# Patient Record
Sex: Female | Born: 1995 | Race: Black or African American | Hispanic: No | Marital: Single | Smoking: Former smoker
Health system: Southern US, Community
[De-identification: ages and names within clinical notes are randomized; demographics above are authoritative.]

## PROBLEM LIST (undated history)

## (undated) ENCOUNTER — Inpatient Hospital Stay (HOSPITAL_COMMUNITY): Payer: Self-pay

## (undated) DIAGNOSIS — F909 Attention-deficit hyperactivity disorder, unspecified type: Secondary | ICD-10-CM

## (undated) DIAGNOSIS — F32A Depression, unspecified: Secondary | ICD-10-CM

## (undated) DIAGNOSIS — O24419 Gestational diabetes mellitus in pregnancy, unspecified control: Secondary | ICD-10-CM

## (undated) DIAGNOSIS — O165 Unspecified maternal hypertension, complicating the puerperium: Secondary | ICD-10-CM

## (undated) HISTORY — DX: Attention-deficit hyperactivity disorder, unspecified type: F90.9

## (undated) HISTORY — DX: Unspecified maternal hypertension, complicating the puerperium: O16.5

## (undated) HISTORY — DX: Depression, unspecified: F32.A

## (undated) HISTORY — PX: NO PAST SURGERIES: SHX2092

## (undated) HISTORY — PX: COSMETIC SURGERY: SHX468

---
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
## 2015-09-02 DIAGNOSIS — O09899 Supervision of other high risk pregnancies, unspecified trimester: Secondary | ICD-10-CM | POA: Insufficient documentation

## 2017-04-30 HISTORY — PX: CHOLECYSTECTOMY: SHX55

## 2017-05-02 ENCOUNTER — Inpatient Hospital Stay (HOSPITAL_COMMUNITY)
Admission: AD | Admit: 2017-05-02 | Discharge: 2017-05-02 | Disposition: A | Discharge status: Home / Self Care | Payer: Self-pay | Source: Ambulatory Visit | Attending: Obstetrics and Gynecology | Admitting: Obstetrics and Gynecology

## 2017-05-02 ENCOUNTER — Other Ambulatory Visit: Payer: Self-pay

## 2017-05-02 ENCOUNTER — Encounter (HOSPITAL_COMMUNITY): Payer: Self-pay

## 2017-05-02 DIAGNOSIS — K259 Gastric ulcer, unspecified as acute or chronic, without hemorrhage or perforation: Secondary | ICD-10-CM | POA: Insufficient documentation

## 2017-05-02 DIAGNOSIS — Z8632 Personal history of gestational diabetes: Secondary | ICD-10-CM | POA: Insufficient documentation

## 2017-05-02 DIAGNOSIS — R109 Unspecified abdominal pain: Secondary | ICD-10-CM

## 2017-05-02 DIAGNOSIS — Z87891 Personal history of nicotine dependence: Secondary | ICD-10-CM | POA: Insufficient documentation

## 2017-05-02 DIAGNOSIS — R1030 Lower abdominal pain, unspecified: Secondary | ICD-10-CM | POA: Insufficient documentation

## 2017-05-02 DIAGNOSIS — G8929 Other chronic pain: Secondary | ICD-10-CM

## 2017-05-02 DIAGNOSIS — R1013 Epigastric pain: Secondary | ICD-10-CM

## 2017-05-02 DIAGNOSIS — K279 Peptic ulcer, site unspecified, unspecified as acute or chronic, without hemorrhage or perforation: Secondary | ICD-10-CM

## 2017-05-02 HISTORY — DX: Gestational diabetes mellitus in pregnancy, unspecified control: O24.419

## 2017-05-02 LAB — URINALYSIS, ROUTINE W REFLEX MICROSCOPIC
Bacteria, UA: NONE SEEN
Bilirubin Urine: NEGATIVE
Glucose, UA: NEGATIVE mg/dL
Hgb urine dipstick: NEGATIVE
Ketones, ur: NEGATIVE mg/dL
Nitrite: NEGATIVE
Protein, ur: NEGATIVE mg/dL
Specific Gravity, Urine: 1.028 (ref 1.005–1.030)
pH: 5 (ref 5.0–8.0)

## 2017-05-02 LAB — POCT PREGNANCY, URINE: Preg Test, Ur: NEGATIVE

## 2017-05-02 MED ORDER — FAMOTIDINE 20 MG PO TABS: 20.0000 mg | ORAL_TABLET | Freq: Two times a day (BID) | ORAL | 0 refills | Status: DC

## 2017-05-02 MED ORDER — KETOROLAC TROMETHAMINE 60 MG/2ML IM SOLN
60.0000 mg | Freq: Once | INTRAMUSCULAR | Status: AC
  Administered 2017-05-02: 60 mg via INTRAMUSCULAR
  Filled 2017-05-02: qty 2

## 2017-05-02 MED ORDER — GI COCKTAIL ~~LOC~~
30.0000 mL | Freq: Once | ORAL | Status: AC
  Administered 2017-05-02: 30 mL via ORAL
  Filled 2017-05-02: qty 30

## 2017-05-02 NOTE — MAU Note (Signed)
[place]ce[place] in [place], Dr [facility] - pt moving to this area. Had a baby Oct 13th, Vaginal delivery.  Upper abd pain since after delivery.  Feels cramping, sharp, and bunched up - sometimes worse after eating, now the pain is every day.  Dr Elwyn ReachBooth wanted her to have an ultrasound, was scheduled for November but was unable to go due to insurance reason. No bleeding. No birth control.

## 2017-05-02 NOTE — MAU Provider Note (Signed)
Chief Complaint: Abdominal Pain   First Provider Initiated Contact with Patient 05/02/17 0226      SUBJECTIVE HPI: Hayley Lawson is a 22 y.o. G3P3003 not pregnant patient who presents to maternity admissions reporting stomach pain for the last two months since she delivered her baby at [facility]. She presents to MAU for the pain due to planning on moving here this weekend.  She denies vaginal bleeding, vaginal itching/burning, urinary symptoms, h/a, dizziness, n/v, or fever/chills. She reports her DR in [place] stated she needs to get an [place] which has been scheduled there.  Abdominal Pain  This is a chronic problem. The current episode started more than 1 month ago. The onset quality is gradual. The problem occurs 2 to 4 times per day. The problem has been unchanged. The pain is located in the epigastric region. The pain is at a severity of 3/10. The quality of the pain is sharp and aching. The abdominal pain does not radiate. Associated symptoms include belching. Pertinent negatives include no constipation, diarrhea, headaches, nausea or vomiting. The pain is aggravated by eating and certain positions. The pain is relieved by nothing. She has tried antacids for the symptoms. The treatment provided mild relief. There is no history of abdominal surgery, gallstones or GERD.    Past Medical History:  Diagnosis Date  . Gestational diabetes    History reviewed. No pertinent surgical history. Social History   Socioeconomic History  . Marital status: Single    Spouse name: Not on file  . Number of children: Not on file  . Years of education: Not on file  . Highest education level: Not on file  Social Needs  . Financial resource strain: Not on file  . Food insecurity - worry: Not on file  . Food insecurity - inability: Not on file  . Transportation needs - medical: Not on file  . Transportation needs - non-medical: Not on file  Occupational History  . Not on file  Tobacco Use  .  Smoking status: Former Games developer  . Smokeless tobacco: Never Used  Substance and Sexual Activity  . Alcohol use: No  . Drug use: No  . Sexual activity: Not on file  Other Topics Concern  . Not on file  Social History Narrative  . Not on file   No current facility-administered medications on file prior to encounter.    Current Outpatient Medications on File Prior to Encounter  Medication Sig Dispense Refill  . OVER THE COUNTER MEDICATION Place 3 drops into the right eye 3 (three) times daily. [facility] homeopathic iriated eye drop     No Known Allergies  ROS:  Review of Systems  Constitutional: Negative.   Respiratory: Negative.   Cardiovascular: Negative.   Gastrointestinal: Positive for abdominal pain. Negative for constipation, diarrhea, nausea and vomiting.  Genitourinary: Negative.   Musculoskeletal: Negative.   Neurological: Negative for headaches.  Psychiatric/Behavioral: Negative.    I have reviewed patient's Past Medical Hx, Surgical Hx, Family Hx, Social Hx, medications and allergies.   Physical Exam   Patient Vitals for the past 24 hrs:  BP Temp Temp src Pulse Resp SpO2 Height Weight  05/02/17 0310 134/66 97.7 F (36.5 C) Oral 71 17 100 % - -  05/02/17 0151 108/73 97.9 F (36.6 C) Oral 83 18 100 % 5\' 2"  (1.575 m) 181 lb (82.1 kg)   Constitutional: Well-developed, well-nourished female in no acute distress.  Cardiovascular: normal rate Respiratory: normal effort GI: Abd soft, non-tender. Pos BS x 4  MS: Extremities nontender, no edema, normal ROM Neurologic: Alert and oriented x 4.  GU: Neg CVAT.  LAB RESULTS Results for orders placed or performed during the hospital encounter of 05/02/17 (from the past 24 hour(s))  Urinalysis, Routine w reflex microscopic     Status: Abnormal   Collection Time: 05/02/17  1:50 AM  Result Value Ref Range   Color, Urine YELLOW YELLOW   APPearance HAZY (A) CLEAR   Specific Gravity, Urine 1.028 1.005 - 1.030   pH 5.0 5.0 - 8.0    Glucose, UA NEGATIVE NEGATIVE mg/dL   Hgb urine dipstick NEGATIVE NEGATIVE   Bilirubin Urine NEGATIVE NEGATIVE   Ketones, ur NEGATIVE NEGATIVE mg/dL   Protein, ur NEGATIVE NEGATIVE mg/dL   Nitrite NEGATIVE NEGATIVE   Leukocytes, UA TRACE (A) NEGATIVE   RBC / HPF 0-5 0 - 5 RBC/hpf   WBC, UA 6-30 0 - 5 WBC/hpf   Bacteria, UA NONE SEEN NONE SEEN   Squamous Epithelial / LPF 6-30 (A) NONE SEEN   Mucus PRESENT   Pregnancy, urine POC     Status: None   Collection Time: 05/02/17  2:01 AM  Result Value Ref Range   Preg Test, Ur NEGATIVE NEGATIVE     MAU Management/MDM: Orders Placed This Encounter  Procedures  . Urinalysis, Routine w reflex microscopic  . Pregnancy, urine POC    Meds ordered this encounter  Medications  . gi cocktail (Maalox,Lidocaine,Donnatal)  . ketorolac (TORADOL) injection 60 mg    Treatments in MAU included GI cocktail- mild relief with medication, Toradol 60mg  IM- relief from pain, rates pain 1/10. Pain most likely related to Peptic ulcer. Pt discharged with instructions to follow up as scheduled with dr in [place] or to start primary care here. Educated on reasons to return to MAU and reasons to go to Urgent care/EDs   ASSESSMENT 1. Abdominal pain in female patient   2. Chronic epigastric pain   3. Peptic ulcer     PLAN Discharge home Educated on Peptic ulcer with information given on treatments plus food that can aggravate ulcer [facility] or call office in the morning to get into primary care  Rx for Pepcid   Allergies as of 05/02/2017   No Known Allergies     Medication List    STOP taking these medications   OVER THE COUNTER MEDICATION     TAKE these medications   famotidine 20 MG tablet Commonly known as:  PEPCID Take 1 tablet (20 mg total) by mouth 2 (two) times daily.       Steward DroneVeronica Braylin Xu  Certified Nurse-Midwife 05/02/2017  2:37 AM

## 2017-05-02 NOTE — Discharge Instructions (Signed)
In late 2019, [facility] will be [facility] to [facility]. At that time, the MAU (Maternity Admissions Unit), where you are being seen today, will no longer take care of non-pregnant patients. We strongly encourage you to find a doctor's office before that time, so that you can be seen with any GYN concerns, like vaginal discharge, urinary tract infection, etc.. in a timely manner.  In order to make an office visit more convenient, the Center [facility] will be offering evening hours with same-day appointments, walk-in appointments and scheduled appointments available during this time.  Center for [facility] @ [facility] Hours: Monday - 8am - 7:30 pm with walk-in between 4pm- 7:30 pm Tuesday - 8 am - 5 pm (starting 07/30/17 we will be open late and accepting walk-ins from 4pm - 7:30pm) Wednesday - 8 am - 5 pm (starting 10/30/17 we will be open late and accepting walk-ins from 4pm - 7:30pm) Thursday 8 am - 5 pm (starting 01/30/18 we will be open late and accepting walk-ins from 4pm - 7:30pm) Friday 8 am - 5 pm  For an appointment please call the Center for [facility] @ [facility] at [phone]  For urgent needs, [facility] is also available for management of urgent GYN complaints such as vaginal discharge or urinary tract infections.  Abdominal Pain, Adult Abdominal pain can be caused by many things. Often, abdominal pain is not serious and it gets better with no treatment or by being treated at home. However, sometimes abdominal pain is serious. Your health care provider will do a medical history and a physical exam to try to determine the cause of your abdominal pain. Follow these instructions at home:  Take over-the-counter and prescription medicines only as told by your health care provider. Do not take a laxative unless told by your health care provider.  Drink enough fluid to keep your urine clear or pale  yellow.  Watch your condition for any changes.  Keep all follow-up visits as told by your health care provider. This is important. Contact a health care provider if:  Your abdominal pain changes or gets worse.  You are not hungry or you lose weight without trying.  You are constipated or have diarrhea for more than 2-3 days.  You have pain when you urinate or have a bowel movement.  Your abdominal pain wakes you up at night.  Your pain gets worse with meals, after eating, or with certain foods.  You are throwing up and cannot keep anything down.  You have a fever. Get help right away if:  Your pain does not go away as soon as your health care provider told you to expect.  You cannot stop throwing up.  Your pain is only in areas of the abdomen, such as the right side or the left lower portion of the abdomen.  You have bloody or black stools, or stools that look like tar.  You have severe pain, cramping, or bloating in your abdomen.  You have signs of dehydration, such as: ? Dark urine, very little urine, or no urine. ? Cracked lips. ? Dry mouth. ? Sunken eyes. ? Sleepiness. ? Weakness. This information is not intended to replace advice given to you by your health care provider. Make sure you discuss any questions you have with your health care provider. Document Released: 01/24/2005 Document Revised: 11/04/2015 Document Reviewed: 09/28/2015 Elsevier Interactive Patient Education  [employer]2018 Elsevier Inc.

## 2017-06-28 ENCOUNTER — Other Ambulatory Visit: Payer: Self-pay | Admitting: Certified Nurse Midwife

## 2017-08-03 ENCOUNTER — Encounter (HOSPITAL_BASED_OUTPATIENT_CLINIC_OR_DEPARTMENT_OTHER): Payer: Self-pay | Admitting: *Deleted

## 2017-08-03 ENCOUNTER — Other Ambulatory Visit: Payer: Self-pay

## 2017-08-03 ENCOUNTER — Emergency Department (HOSPITAL_BASED_OUTPATIENT_CLINIC_OR_DEPARTMENT_OTHER)
Admission: EM | Admit: 2017-08-03 | Discharge: 2017-08-03 | Disposition: A | Discharge status: Other Acute Inpatient Hospital | Payer: Commercial Managed Care - PPO | Attending: Emergency Medicine | Admitting: Emergency Medicine

## 2017-08-03 ENCOUNTER — Inpatient Hospital Stay (HOSPITAL_COMMUNITY): Payer: Commercial Managed Care - PPO

## 2017-08-03 ENCOUNTER — Inpatient Hospital Stay (EMERGENCY_DEPARTMENT_HOSPITAL)
Admission: AD | Admit: 2017-08-03 | Discharge: 2017-08-03 | Disposition: A | Discharge status: Home / Self Care | Payer: Commercial Managed Care - PPO | Source: Ambulatory Visit | Attending: Obstetrics & Gynecology | Admitting: Obstetrics & Gynecology

## 2017-08-03 ENCOUNTER — Encounter (HOSPITAL_COMMUNITY): Payer: Self-pay | Admitting: *Deleted

## 2017-08-03 DIAGNOSIS — N3000 Acute cystitis without hematuria: Secondary | ICD-10-CM

## 2017-08-03 DIAGNOSIS — Z3490 Encounter for supervision of normal pregnancy, unspecified, unspecified trimester: Secondary | ICD-10-CM

## 2017-08-03 DIAGNOSIS — O2310 Infections of bladder in pregnancy, unspecified trimester: Secondary | ICD-10-CM | POA: Diagnosis not present

## 2017-08-03 DIAGNOSIS — O2341 Unspecified infection of urinary tract in pregnancy, first trimester: Secondary | ICD-10-CM | POA: Insufficient documentation

## 2017-08-03 DIAGNOSIS — O26891 Other specified pregnancy related conditions, first trimester: Secondary | ICD-10-CM

## 2017-08-03 DIAGNOSIS — Z882 Allergy status to sulfonamides status: Secondary | ICD-10-CM

## 2017-08-03 DIAGNOSIS — Z87891 Personal history of nicotine dependence: Secondary | ICD-10-CM | POA: Insufficient documentation

## 2017-08-03 DIAGNOSIS — Z79899 Other long term (current) drug therapy: Secondary | ICD-10-CM | POA: Insufficient documentation

## 2017-08-03 DIAGNOSIS — Z3A01 Less than 8 weeks gestation of pregnancy: Secondary | ICD-10-CM

## 2017-08-03 DIAGNOSIS — R109 Unspecified abdominal pain: Secondary | ICD-10-CM

## 2017-08-03 DIAGNOSIS — Z3A00 Weeks of gestation of pregnancy not specified: Secondary | ICD-10-CM | POA: Insufficient documentation

## 2017-08-03 DIAGNOSIS — Z3491 Encounter for supervision of normal pregnancy, unspecified, first trimester: Secondary | ICD-10-CM

## 2017-08-03 DIAGNOSIS — O9989 Other specified diseases and conditions complicating pregnancy, childbirth and the puerperium: Secondary | ICD-10-CM | POA: Diagnosis present

## 2017-08-03 LAB — URINALYSIS, ROUTINE W REFLEX MICROSCOPIC
BILIRUBIN URINE: NEGATIVE
GLUCOSE, UA: NEGATIVE mg/dL
Hgb urine dipstick: NEGATIVE
KETONES UR: NEGATIVE mg/dL
Nitrite: NEGATIVE
PROTEIN: NEGATIVE mg/dL
Specific Gravity, Urine: 1.025 (ref 1.005–1.030)
pH: 6 (ref 5.0–8.0)

## 2017-08-03 LAB — URINALYSIS, MICROSCOPIC (REFLEX)

## 2017-08-03 LAB — COMPREHENSIVE METABOLIC PANEL
ALT: 57 U/L — AB (ref 14–54)
AST: 23 U/L (ref 15–41)
Albumin: 4 g/dL (ref 3.5–5.0)
Alkaline Phosphatase: 53 U/L (ref 38–126)
Anion gap: 7 (ref 5–15)
BUN: 10 mg/dL (ref 6–20)
CHLORIDE: 106 mmol/L (ref 101–111)
CO2: 23 mmol/L (ref 22–32)
CREATININE: 0.54 mg/dL (ref 0.44–1.00)
Calcium: 9.1 mg/dL (ref 8.9–10.3)
GFR calc Af Amer: >60 mL/min (ref >60)
GLUCOSE: 100 mg/dL — AB (ref 65–99)
Potassium: 3.9 mmol/L (ref 3.5–5.1)
Sodium: 136 mmol/L (ref 135–145)
Total Bilirubin: 0.4 mg/dL (ref 0.3–1.2)
Total Protein: 7.4 g/dL (ref 6.5–8.1)

## 2017-08-03 LAB — CBC
HCT: 38.6 % (ref 36.0–46.0)
Hemoglobin: 13.4 g/dL (ref 12.0–15.0)
MCH: 30.9 pg (ref 26.0–34.0)
MCHC: 34.7 g/dL (ref 30.0–36.0)
MCV: 88.9 fL (ref 78.0–100.0)
PLATELETS: 396 10*3/uL (ref 150–400)
RBC: 4.34 MIL/uL (ref 3.87–5.11)
RDW: 11.7 % (ref 11.5–15.5)
WBC: 11.9 10*3/uL — AB (ref 4.0–10.5)

## 2017-08-03 LAB — WET PREP, GENITAL
CLUE CELLS WET PREP: NONE SEEN
Sperm: NONE SEEN
Trich, Wet Prep: NONE SEEN
Yeast Wet Prep HPF POC: NONE SEEN

## 2017-08-03 LAB — ABO/RH: ABO/RH(D): O POS

## 2017-08-03 LAB — HCG, QUANTITATIVE, PREGNANCY: hCG, Beta Chain, Quant, S: 18183 m[IU]/mL — ABNORMAL HIGH (ref <5)

## 2017-08-03 MED ORDER — NITROFURANTOIN MONOHYD MACRO 100 MG PO CAPS
100.0000 mg | ORAL_CAPSULE | Freq: Once | ORAL | Status: AC
  Administered 2017-08-03: 100 mg via ORAL
  Filled 2017-08-03: qty 1

## 2017-08-03 MED ORDER — ALUM & MAG HYDROXIDE-SIMETH 200-200-20 MG/5ML PO SUSP
30.0000 mL | Freq: Once | ORAL | Status: AC
  Administered 2017-08-03: 30 mL via ORAL
  Filled 2017-08-03: qty 30

## 2017-08-03 MED ORDER — NITROFURANTOIN MONOHYD MACRO 100 MG PO CAPS: 100.0000 mg | ORAL_CAPSULE | Freq: Two times a day (BID) | ORAL | 0 refills | Status: DC

## 2017-08-03 MED ORDER — CEPHALEXIN 500 MG PO CAPS: 500.0000 mg | ORAL_CAPSULE | Freq: Four times a day (QID) | ORAL | 2 refills | Status: DC

## 2017-08-03 NOTE — ED Provider Notes (Signed)
[facility] EMERGENCY DEPARTMENT Provider Note   CSN: [id number] Arrival date & time: 08/03/17  0119     History   Chief Complaint Chief Complaint  Patient presents with  . Abdominal Pain    HPI Hayley Lawson is a 22 y.o. female.  The history is provided by the patient.  Abdominal Pain   This is a recurrent problem. The current episode started more than 1 week ago. The problem occurs constantly. The problem has not changed since onset.The pain is associated with an unknown factor. The pain is located in the epigastric region and LLQ. The quality of the pain is aching. The pain is moderate. Associated symptoms include nausea. Pertinent negatives include fever, melena and dysuria. Nothing aggravates the symptoms. Nothing relieves the symptoms. Past workup does not include CT scan. Her past medical history does not include PUD.  Has had 5 months of epigastric pain since delivering 10/13.  Has had irregular periods since that time and was told last week her pregnancy test was positive.  Has had LLQ pain since.    Past Medical History:  Diagnosis Date  . Gestational diabetes     There are no active problems to display for this patient.   History reviewed. No pertinent surgical history.   OB History    Gravida  4   Para  3   Term  3   Preterm      AB      Living  3     SAB      TAB      Ectopic      Multiple      Live Births  3            Home Medications    Prior to Admission medications   Medication Sig Start Date End Date Taking? Authorizing Provider  famotidine (PEPCID) 20 MG tablet Take 1 tablet (20 mg total) by mouth 2 (two) times daily. 05/02/17   Sharyon Cable, CNM  nitrofurantoin, macrocrystal-monohydrate, (MACROBID) 100 MG capsule Take 1 capsule (100 mg total) by mouth 2 (two) times daily. X 7 days 08/03/17   Nicanor Alcon, Christinna Sprung, MD    Family History No family history on file.  Social History Social History   Tobacco Use  .  Smoking status: Former Games developer  . Smokeless tobacco: Never Used  Substance Use Topics  . Alcohol use: No  . Drug use: No     Allergies   Bactrim [sulfamethoxazole-trimethoprim]   Review of Systems Review of Systems  Constitutional: Negative for fever.  Gastrointestinal: Positive for abdominal pain and nausea. Negative for anal bleeding, blood in stool and melena.  Genitourinary: Negative for dysuria, vaginal bleeding and vaginal discharge.  All other systems reviewed and are negative.    Physical Exam Updated Vital Signs BP 111/71   Pulse 74   Temp 98.3 F (36.8 C) (Oral)   Resp 16   Ht 5\' 2"  (1.575 m)   Wt 83 kg (183 lb)   LMP 06/16/2017   SpO2 100%   BMI 33.47 kg/m   Physical Exam  Constitutional: She is oriented to person, place, and time. She appears well-developed and well-nourished. No distress.  HENT:  Head: Normocephalic and atraumatic.  Nose: Nose normal.  Mouth/Throat: No oropharyngeal exudate.  Eyes: Pupils are equal, round, and reactive to light. Conjunctivae are normal.  Neck: Normal range of motion. Neck supple.  Cardiovascular: Normal rate, regular rhythm, normal heart sounds and intact distal pulses.  Pulmonary/Chest:  Effort normal and breath sounds normal. No stridor. She has no wheezes. She has no rales.  Abdominal: Soft. Bowel sounds are normal. She exhibits no distension, no fluid wave and no mass. There is no tenderness. There is no rebound, no guarding, no tenderness at McBurney's point and negative Murphy's sign.  Musculoskeletal: Normal range of motion.  Neurological: She is alert and oriented to person, place, and time.  Skin: Skin is warm and dry. Capillary refill takes less than 2 seconds.  Psychiatric: She has a normal mood and affect.     ED Treatments / Results  Labs (all labs ordered are listed, but only abnormal results are displayed) Results for orders placed or performed during the hospital encounter of 08/03/17  Comprehensive  metabolic panel  Result Value Ref Range   Sodium 136 135 - 145 mmol/L   Potassium 3.9 3.5 - 5.1 mmol/L   Chloride 106 101 - 111 mmol/L   CO2 23 22 - 32 mmol/L   Glucose, Bld 100 (H) 65 - 99 mg/dL   BUN 10 6 - 20 mg/dL   Creatinine, Ser 1.61 0.44 - 1.00 mg/dL   Calcium 9.1 8.9 - 09.6 mg/dL   Total Protein 7.4 6.5 - 8.1 g/dL   Albumin 4.0 3.5 - 5.0 g/dL   AST 23 15 - 41 U/L   ALT 57 (H) 14 - 54 U/L   Alkaline Phosphatase 53 38 - 126 U/L   Total Bilirubin 0.4 0.3 - 1.2 mg/dL   GFR calc non Af Amer >60 >60 mL/min   GFR calc Af Amer >60 >60 mL/min   Anion gap 7 5 - 15  CBC  Result Value Ref Range   WBC 11.9 (H) 4.0 - 10.5 K/uL   RBC 4.34 3.87 - 5.11 MIL/uL   Hemoglobin 13.4 12.0 - 15.0 g/dL   HCT 04.5 40.9 - 81.1 %   MCV 88.9 78.0 - 100.0 fL   MCH 30.9 26.0 - 34.0 pg   MCHC 34.7 30.0 - 36.0 g/dL   RDW 91.4 78.2 - 95.6 %   Platelets 396 150 - 400 K/uL  Urinalysis, Routine w reflex microscopic  Result Value Ref Range   Color, Urine YELLOW YELLOW   APPearance CLEAR CLEAR   Specific Gravity, Urine 1.025 1.005 - 1.030   pH 6.0 5.0 - 8.0   Glucose, UA NEGATIVE NEGATIVE mg/dL   Hgb urine dipstick NEGATIVE NEGATIVE   Bilirubin Urine NEGATIVE NEGATIVE   Ketones, ur NEGATIVE NEGATIVE mg/dL   Protein, ur NEGATIVE NEGATIVE mg/dL   Nitrite NEGATIVE NEGATIVE   Leukocytes, UA SMALL (A) NEGATIVE  hCG, quantitative, pregnancy  Result Value Ref Range   hCG, Beta Chain, Quant, S 18,183 (H) <5 mIU/mL  Urinalysis, Microscopic (reflex)  Result Value Ref Range   RBC / HPF 0-5 0 - 5 RBC/hpf   WBC, UA 6-30 0 - 5 WBC/hpf   Bacteria, UA MANY (A) NONE SEEN   Squamous Epithelial / LPF 6-30 (A) NONE SEEN   Mucus PRESENT    No results found.  EKG None  Radiology No results found.  Procedures Procedures (including critical care time)  Medications Ordered in ED Medications  nitrofurantoin (macrocrystal-monohydrate) (MACROBID) capsule 100 mg (100 mg Oral Given 08/03/17 0248)  alum & mag  hydroxide-simeth (MAALOX/MYLANTA) 200-200-20 MG/5ML suspension 30 mL (30 mLs Oral Given 08/03/17 0328)     Case d/w Dr. Despina Hidden will transport POV to MAU for [place] to determine location of pregancy   Exam is benign and  reassuring, I see no indication for CT imaging at this time.  No signs of acute cholecystitis.  She is stable for follow up with her PMD as an outpatient for this issue.    Final Clinical Impressions(s) / ED Diagnoses   Final diagnoses:  Pregnancy, unspecified gestational age  Acute cystitis without hematuria   Transfer to MAU to determine location of pregnancy.   ED Discharge Orders        Ordered    nitrofurantoin, macrocrystal-monohydrate, (MACROBID) 100 MG capsule  2 times daily     08/03/17 0412       Jini Horiuchi, MD 08/03/17 0440

## 2017-08-03 NOTE — Discharge Instructions (Signed)
[facility] Providers  ° ° °[facility]       Phone: [phone] ° °[facility] Phone: [phone] ° °[facility] [place]  Phone: [phone] ° °[facility] [place]  Phone: [phone] ° °[facility] [place]  Phone: [phone] ° °[facility]       Phone: [phone] ° °[facility]    Phone: [phone]  ° °[facility]    Phone: [phone] ° °[facility]    Phone: [phone] ° °[facility]    Phone: [phone] ° °[facility]    Phone: [phone] ° °[facility]       Phone: [phone]  ° °[facility]  Phone: [phone] ° °[facility]    Phone: [phone] ° °[facility]   Phone: [phone] ° °[facility]      Phone: [phone] ° °[facility]    Phone: [phone] ° °

## 2017-08-03 NOTE — ED Triage Notes (Signed)
Pt reports for 5 months (since the delivery of her last baby) she has had pain in the upper abdomen that comes and goes, describes as sharp. Also c/o left lower abdominal pain that has been constant. LBM just PTA, no fever,  no vaginal discharge/bleeding. Pain with urination. Vomiting today, no diarrhea. Pt reports she is pregnant (LMP 06/16/17).

## 2017-08-03 NOTE — MAU Provider Note (Signed)
Ectopic eval from [facility]  IUP UTI, changed macrobid to keflex to save cost  Chief Complaint: Abdominal Pain   None     SUBJECTIVE HPI: Hayley Lawson is a 22 y.o. G4P3003 at [redacted]w[redacted]d by LMP who presents to maternity admissions sent from [facility] for abdominal pain in early pregnancy. Her pain is constant and in the left lower quadrant, starting 1 week ago.  She had positive HPT last week. She has not tried any treatments. She has mild nausea without vomiting. There are no associated symptoms.  She had hcg of 18,183 today at [facility] and was diagnosed with UTI with Rx for Macrobid. She denies vaginal bleeding, vaginal itching/burning, urinary symptoms, h/a, dizziness, or fever/chills.     HPI  Past Medical History:  Diagnosis Date  . Gestational diabetes    Past Surgical History:  Procedure Laterality Date  . NO PAST SURGERIES     Social History   Socioeconomic History  . Marital status: Single    Spouse name: Not on file  . Number of children: Not on file  . Years of education: Not on file  . Highest education level: Not on file  Occupational History  . Not on file  Social Needs  . Financial resource strain: Not on file  . Food insecurity:    Worry: Not on file    Inability: Not on file  . Transportation needs:    Medical: Not on file    Non-medical: Not on file  Tobacco Use  . Smoking status: Former Games developer  . Smokeless tobacco: Never Used  Substance and Sexual Activity  . Alcohol use: No  . Drug use: No  . Sexual activity: Yes    Birth control/protection: None  Lifestyle  . Physical activity:    Days per week: Not on file    Minutes per session: Not on file  . Stress: Not on file  Relationships  . Social connections:    Talks on phone: Not on file    Gets together: Not on file    Attends religious service: Not on file    Active member of club or organization: Not on file    Attends meetings of clubs or organizations: Not on file    Relationship status: Not on  file  . Intimate partner violence:    Fear of current or ex partner: Not on file    Emotionally abused: Not on file    Physically abused: Not on file    Forced sexual activity: Not on file  Other Topics Concern  . Not on file  Social History Narrative  . Not on file   No current facility-administered medications on file prior to encounter.    Current Outpatient Medications on File Prior to Encounter  Medication Sig Dispense Refill  . famotidine (PEPCID) 20 MG tablet Take 1 tablet (20 mg total) by mouth 2 (two) times daily. 30 tablet 0   Allergies  Allergen Reactions  . Bactrim [Sulfamethoxazole-Trimethoprim] Rash    ROS:  Review of Systems  Constitutional: Negative for chills, fatigue and fever.  Respiratory: Negative for shortness of breath.   Cardiovascular: Negative for chest pain.  Gastrointestinal: Positive for abdominal pain. Negative for nausea and vomiting.  Genitourinary: Positive for pelvic pain. Negative for difficulty urinating, dysuria, flank pain, vaginal bleeding, vaginal discharge and vaginal pain.  Musculoskeletal: Negative for back pain.  Neurological: Negative for dizziness and headaches.  Psychiatric/Behavioral: Negative.      I have reviewed patient's Past Medical Hx,  Surgical Hx, Family Hx, Social Hx, medications and allergies.   Physical Exam   Patient Vitals for the past 24 hrs:  BP Temp Temp src Pulse Resp Weight  08/03/17 0636 123/75 98.1 F (36.7 C) Oral 85 17 -  08/03/17 0524 117/61 98 F (36.7 C) Oral 88 17 -  08/03/17 0515 - - - - - 83.9 kg (185 lb 0.6 oz)   Constitutional: Well-developed, well-nourished female in no acute distress.  Cardiovascular: normal rate Respiratory: normal effort GI: Abd soft, non-tender. Pos BS x 4 MS: Extremities nontender, no edema, normal ROM Neurologic: Alert and oriented x 4.  GU: Neg CVAT.  PELVIC EXAM: wet prep/GCC collected by blind swab   LAB RESULTS Results for orders placed or performed  during the hospital encounter of 08/03/17 (from the past 24 hour(s))  ABO/Rh     Status: None   Collection Time: 08/03/17  5:24 AM  Result Value Ref Range   ABO/RH(D)      O POS Performed at [facility], [address] [place], [place] [address]   Wet prep, genital     Status: Abnormal   Collection Time: 08/03/17  6:14 AM  Result Value Ref Range   Yeast Wet Prep HPF POC NONE SEEN NONE SEEN   Trich, Wet Prep NONE SEEN NONE SEEN   Clue Cells Wet Prep HPF POC NONE SEEN NONE SEEN   WBC, Wet Prep HPF POC FEW (A) NONE SEEN   Sperm NONE SEEN     --/--/O POS Performed at [facility], [address] [place], [place] [address]  [phone](04/06 0524)  IMAGING [place] Ob Less Than 14 Weeks With Ob Transvaginal  Result Date: 08/03/2017 CLINICAL DATA:  22 y/o  F; pain. EXAM: OBSTETRIC <14 WK US AND TRANSVAGINAL OB US TECHNIQUE: Both transabdominal and transvaginal ultrasound examinations were performed for complete evaluation of the gestation as well as the maternal uterus, adnexal regions, and pelvic cul-de-sac. Transvaginal technique was performed to assess early pregnancy. COMPARISON:  None. FINDINGS: Intrauterine gestational sac: Single Yolk sac:  Visualized. Embryo:  Visualized. Cardiac Activity: Visualized. Heart Rate: 110 bpm CRL:  4.6 mm mm   6 w   1 d                  US EDC: 03/28/2018 Subchorionic hemorrhage: Small subchorionic hemorrhage measuring 2.6 x 0.8 cm. Maternal uterus/adnexae: Normal. IMPRESSION: 1. Single live intrauterine pregnancy with estimated gestational age of [redacted] weeks and 1 day. 2. Small subchorionic hemorrhage. Electronically Signed   By: Mitzi HansenLance  Furusawa-Stratton M.D.   On: 08/03/2017 06:20    MAU Management/MDM: Ectopic eval started at [facility] with labwork and exam and [facility].  Rx for Macrobid for possible UTI sent prior to her arrival in MAU.  On my exam, pt without evidence of acute or surgical abdomen.  OB US ordered and vaginal cultures obtained.   Wet prep wnl.  US shows live IUP c/w LMP dating.  Pt to start prenatal care as desired, list of providers given. Discussed small subchorionic hemorrhage found incidentally on US with pt as likely low risk.  I changed Rx from Macrobid to Keflex QID x 7 days for safety in first trimester and cost as pt is unsure of her prescription coverage.  Questions answered.  Return to MAU for emergencies. Pt discharged with strict abdominal pain in pregnancy precautions.  ASSESSMENT 1. Normal IUP (intrauterine pregnancy) on prenatal ultrasound, first trimester   2. Abdominal pain during  pregnancy in first trimester   3. UTI (urinary tract infection) during pregnancy, first trimester     PLAN Discharge home Allergies as of 08/03/2017      Reactions   Bactrim [sulfamethoxazole-trimethoprim] Rash      Medication List    STOP taking these medications   nitrofurantoin (macrocrystal-monohydrate) 100 MG capsule Commonly known as:  MACROBID     TAKE these medications   cephALEXin 500 MG capsule Commonly known as:  KEFLEX Take 1 capsule (500 mg total) by mouth 4 (four) times daily.   famotidine 20 MG tablet Commonly known as:  PEPCID Take 1 tablet (20 mg total) by mouth 2 (two) times daily.      Follow-up Information    Prenatal provider of your choice Follow up.           Sharen Counter Certified Nurse-Midwife 08/03/2017  1:59 PM

## 2017-08-05 LAB — GC/CHLAMYDIA PROBE AMP (~~LOC~~) NOT AT ARMC
Chlamydia: NEGATIVE
Neisseria Gonorrhea: NEGATIVE

## 2017-08-20 ENCOUNTER — Other Ambulatory Visit: Payer: Self-pay | Admitting: Family Medicine

## 2017-08-20 DIAGNOSIS — R1011 Right upper quadrant pain: Secondary | ICD-10-CM

## 2017-08-21 ENCOUNTER — Ambulatory Visit
Admission: RE | Admit: 2017-08-21 | Discharge: 2017-08-21 | Disposition: A | Discharge status: Home / Self Care | Payer: Commercial Managed Care - PPO | Source: Ambulatory Visit | Attending: Family Medicine | Admitting: Family Medicine

## 2017-08-21 DIAGNOSIS — R1011 Right upper quadrant pain: Secondary | ICD-10-CM

## 2018-01-31 ENCOUNTER — Other Ambulatory Visit: Payer: Self-pay | Admitting: General Surgery

## 2018-06-15 ENCOUNTER — Encounter (HOSPITAL_COMMUNITY): Payer: Self-pay

## 2019-04-19 ENCOUNTER — Other Ambulatory Visit: Payer: Self-pay

## 2019-04-19 ENCOUNTER — Encounter (HOSPITAL_COMMUNITY): Payer: Self-pay | Admitting: Emergency Medicine

## 2019-04-19 ENCOUNTER — Emergency Department (HOSPITAL_COMMUNITY)
Admission: EM | Admit: 2019-04-19 | Discharge: 2019-04-20 | Disposition: A | Discharge status: Home / Self Care | Payer: Commercial Managed Care - PPO | Attending: Emergency Medicine | Admitting: Emergency Medicine

## 2019-04-19 ENCOUNTER — Emergency Department (HOSPITAL_COMMUNITY): Payer: Commercial Managed Care - PPO

## 2019-04-19 DIAGNOSIS — R519 Headache, unspecified: Secondary | ICD-10-CM | POA: Insufficient documentation

## 2019-04-19 DIAGNOSIS — R0602 Shortness of breath: Secondary | ICD-10-CM | POA: Insufficient documentation

## 2019-04-19 DIAGNOSIS — Z87891 Personal history of nicotine dependence: Secondary | ICD-10-CM | POA: Insufficient documentation

## 2019-04-19 DIAGNOSIS — R404 Transient alteration of awareness: Secondary | ICD-10-CM | POA: Diagnosis not present

## 2019-04-19 DIAGNOSIS — R4701 Aphasia: Secondary | ICD-10-CM | POA: Diagnosis not present

## 2019-04-19 DIAGNOSIS — R0789 Other chest pain: Secondary | ICD-10-CM | POA: Insufficient documentation

## 2019-04-19 LAB — COMPREHENSIVE METABOLIC PANEL
ALT: 26 U/L (ref 0–44)
AST: 22 U/L (ref 15–41)
Albumin: 3.8 g/dL (ref 3.5–5.0)
Alkaline Phosphatase: 56 U/L (ref 38–126)
Anion gap: 11 (ref 5–15)
BUN: 11 mg/dL (ref 6–20)
CO2: 22 mmol/L (ref 22–32)
Calcium: 9.3 mg/dL (ref 8.9–10.3)
Chloride: 103 mmol/L (ref 98–111)
Creatinine, Ser: 0.67 mg/dL (ref 0.44–1.00)
GFR calc Af Amer: >60 mL/min (ref >60)
GFR calc non Af Amer: >60 mL/min (ref >60)
Glucose, Bld: 112 mg/dL — ABNORMAL HIGH (ref 70–99)
Potassium: 3.7 mmol/L (ref 3.5–5.1)
Sodium: 136 mmol/L (ref 135–145)
Total Bilirubin: 0.6 mg/dL (ref 0.3–1.2)
Total Protein: 7.3 g/dL (ref 6.5–8.1)

## 2019-04-19 LAB — CBC
HCT: 38.5 % (ref 36.0–46.0)
Hemoglobin: 12.7 g/dL (ref 12.0–15.0)
MCH: 29.5 pg (ref 26.0–34.0)
MCHC: 33 g/dL (ref 30.0–36.0)
MCV: 89.3 fL (ref 80.0–100.0)
Platelets: 473 10*3/uL — ABNORMAL HIGH (ref 150–400)
RBC: 4.31 MIL/uL (ref 3.87–5.11)
RDW: 12.1 % (ref 11.5–15.5)
WBC: 10.4 10*3/uL (ref 4.0–10.5)
nRBC: 0 % (ref 0.0–0.2)

## 2019-04-19 LAB — I-STAT BETA HCG BLOOD, ED (MC, WL, AP ONLY): I-stat hCG, quantitative: <5 m[IU]/mL (ref <5)

## 2019-04-19 LAB — I-STAT CHEM 8, ED
BUN: 12 mg/dL (ref 6–20)
Calcium, Ion: 1.16 mmol/L (ref 1.15–1.40)
Chloride: 104 mmol/L (ref 98–111)
Creatinine, Ser: 0.6 mg/dL (ref 0.44–1.00)
Glucose, Bld: 109 mg/dL — ABNORMAL HIGH (ref 70–99)
HCT: 38 % (ref 36.0–46.0)
Hemoglobin: 12.9 g/dL (ref 12.0–15.0)
Potassium: 3.7 mmol/L (ref 3.5–5.1)
Sodium: 140 mmol/L (ref 135–145)
TCO2: 27 mmol/L (ref 22–32)

## 2019-04-19 LAB — DIFFERENTIAL
Abs Immature Granulocytes: 0.03 10*3/uL (ref 0.00–0.07)
Basophils Absolute: 0.1 10*3/uL (ref 0.0–0.1)
Basophils Relative: 1 %
Eosinophils Absolute: 0.1 10*3/uL (ref 0.0–0.5)
Eosinophils Relative: 1 %
Immature Granulocytes: 0 %
Lymphocytes Relative: 31 %
Lymphs Abs: 3.2 10*3/uL (ref 0.7–4.0)
Monocytes Absolute: 0.9 10*3/uL (ref 0.1–1.0)
Monocytes Relative: 8 %
Neutro Abs: 6.1 10*3/uL (ref 1.7–7.7)
Neutrophils Relative %: 59 %

## 2019-04-19 MED ORDER — SODIUM CHLORIDE 0.9% FLUSH: 3.0000 mL | Freq: Once | INTRAVENOUS | Status: DC

## 2019-04-19 NOTE — ED Triage Notes (Addendum)
Pt arrives via [facility] from home, ems reports pt was wrapping presents when she wrote a note to her husband stating "I feel like my brain wont let me talk." ems reports pt will answer yes or no questions by shaking her head, follows all commands, no neuro deficits present. Pt was able to answer her daughters age with clear speech but would not answer any other questions. Pt endorses increased emotional stress recently when asked. EMS VS: Hr96, O299, 138/100, cbg 107. Pt did endorse recent covid exposure but unable to say when. Face symmetrical, grip strength equal, moves all limbs equally and appears to be oriented based on nodding yes or no to questions appropriately.

## 2019-04-19 NOTE — ED Notes (Signed)
This RN consulted with Quincy Carnes, PA regarding orders for patient. PA advised to place stroke orders but not to call code stroke at this time.

## 2019-04-20 DIAGNOSIS — R404 Transient alteration of awareness: Secondary | ICD-10-CM | POA: Diagnosis not present

## 2019-04-20 LAB — URINALYSIS, ROUTINE W REFLEX MICROSCOPIC
Bilirubin Urine: NEGATIVE
Glucose, UA: NEGATIVE mg/dL
Ketones, ur: NEGATIVE mg/dL
Leukocytes,Ua: NEGATIVE
Nitrite: NEGATIVE
Protein, ur: NEGATIVE mg/dL
Specific Gravity, Urine: 1.026 (ref 1.005–1.030)
pH: 5 (ref 5.0–8.0)

## 2019-04-20 LAB — POC URINE PREG, ED: Preg Test, Ur: NEGATIVE

## 2019-04-20 NOTE — ED Notes (Signed)
Pt reporting onset of a pain in her chest and head tonight, stating that she felt like she could not talk. Says she feels better now, has a pain on the left side of her head. Stating her children tested positive on Thursday for covid, her test was negative.

## 2019-04-20 NOTE — ED Provider Notes (Signed)
Emergency Department Provider Note   I have reviewed the triage vital signs and the nursing notes.   HISTORY  Chief Complaint Aphasia   HPI Hayley Lawson is a 23 y.o. female without medical problems who presents the emerge department today for transient difficulty speaking.  Patient states that she was in her normal state of health when she started having some left-sided cheek pain started radiating into her head and progressively worsened.  She had a little bit episode of shortness of breath and then felt like she could not speak.  She states that she was forming words in her head but could not figure out how to talk.  During this episode last approximately 3 hours patient was able to speak to EMS but then could not speak again.  She states at some point prior to seeing me, in the waiting room, she regained her ability to speak.  She has no other neurologic changes.  She has 2 children who have coronavirus she was diagnosed negative but does not feel that this is stress throughout the much.  No recent illnesses.   No other associated or modifying symptoms.    Past Medical History:  Diagnosis Date  . Gestational diabetes     There are no problems to display for this patient.   Past Surgical History:  Procedure Laterality Date  . NO PAST SURGERIES      Current Outpatient Rx  . Order #: [id number] Class: Normal  . Order #: [id number] Class: Normal    Allergies Bactrim [sulfamethoxazole-trimethoprim]  Family History  Problem Relation Age of Onset  . Alcohol abuse Neg Hx   . Arthritis Neg Hx   . Asthma Neg Hx   . Birth defects Neg Hx   . COPD Neg Hx   . Cancer Neg Hx   . Depression Neg Hx   . Diabetes Neg Hx   . Drug abuse Neg Hx   . Early death Neg Hx   . Hearing loss Neg Hx   . Heart disease Neg Hx   . Hyperlipidemia Neg Hx   . Hypertension Neg Hx   . Kidney disease Neg Hx   . Learning disabilities Neg Hx   . Mental illness Neg Hx   . Mental retardation Neg Hx     . Miscarriages / Stillbirths Neg Hx   . Stroke Neg Hx   . Vision loss Neg Hx   . Varicose Veins Neg Hx     Social History Social History   Tobacco Use  . Smoking status: Former Games developermoker  . Smokeless tobacco: Never Used  Substance Use Topics  . Alcohol use: No  . Drug use: No    Review of Systems  All other systems negative except as documented in the HPI. All pertinent positives and negatives as reviewed in the HPI. ____________________________________________   PHYSICAL EXAM:  VITAL SIGNS: ED Triage Vitals  Enc Vitals Group     BP 04/19/19 2236 134/84     Pulse Rate 04/19/19 2236 100     Resp 04/19/19 2237 14     Temp 04/19/19 2236 98.7 F (37.1 C)     Temp Source 04/19/19 2236 Oral     SpO2 04/19/19 2236 100 %    Constitutional: Alert and oriented. Well appearing and in no acute distress. Eyes: Conjunctivae are normal. PERRL. EOMI. Head: Atraumatic. Nose: No congestion/rhinnorhea. Mouth/Throat: Mucous membranes are moist.  Oropharynx non-erythematous. Neck: No stridor.  No meningeal signs.   Cardiovascular: Normal rate, regular rhythm.  Good peripheral circulation. Grossly normal heart sounds.   Respiratory: Normal respiratory effort.  No retractions. Lungs CTAB. Gastrointestinal: Soft and nontender. No distention.  Musculoskeletal: No lower extremity tenderness nor edema. No gross deformities of extremities. Neurologic:  No altered mental status, able to give full seemingly accurate history.  Face is symmetric, EOM's intact, pupils equal and reactive, vision intact, tongue and uvula midline without deviation. Upper and Lower extremity motor 5/5, intact pain perception in distal extremities, 2+ reflexes in biceps, patella and achilles tendons. Able to perform finger to nose normal with both hands. Walks without assistance or evident ataxia.  Skin:  Skin is warm, dry and intact. No rash noted.   ____________________________________________   LABS (all labs  ordered are listed, but only abnormal results are displayed)  Labs Reviewed  CBC - Abnormal; Notable for the following components:      Result Value   Platelets 473 (*)    All other components within normal limits  COMPREHENSIVE METABOLIC PANEL - Abnormal; Notable for the following components:   Glucose, Bld 112 (*)    All other components within normal limits  URINALYSIS, ROUTINE W REFLEX MICROSCOPIC - Abnormal; Notable for the following components:   APPearance CLOUDY (*)    Hgb urine dipstick MODERATE (*)    Bacteria, UA RARE (*)    All other components within normal limits  I-STAT CHEM 8, ED - Abnormal; Notable for the following components:   Glucose, Bld 109 (*)    All other components within normal limits  URINE CULTURE  DIFFERENTIAL  PROTIME-INR  APTT  I-STAT BETA HCG BLOOD, ED ([facility], [facility], [facility] ONLY)  POC URINE PREG, ED   ____________________________________________  EKG   EKG Interpretation  Date/Time:  Sunday April 19 2019 22:42:51 EST Ventricular Rate:  104 PR Interval:  146 QRS Duration: 78 QT Interval:  322 QTC Calculation: 423 R Axis:   81 Text Interpretation: Sinus tachycardia Otherwise normal ECG No old tracing to compare Confirmed by Dione Booze ([id number]) on 04/19/2019 11:37:23 PM       ____________________________________________  RADIOLOGY  CT HEAD WO CONTRAST  Result Date: 04/19/2019 CLINICAL DATA:  Speech difficulty EXAM: CT HEAD WITHOUT CONTRAST TECHNIQUE: Contiguous axial images were obtained from the base of the skull through the vertex without intravenous contrast. COMPARISON:  None. FINDINGS: Brain: No evidence of acute territorial infarction, hemorrhage, hydrocephalus,extra-axial collection or mass lesion/mass effect. Normal gray-white differentiation. Ventricles are normal in size and contour. Vascular: No hyperdense vessel or unexpected calcification. Skull: The skull is intact. No fracture or focal lesion identified. Sinuses/Orbits: There is  complete opacification of the right maxillary sinus. The orbits and globes intact. Other: None IMPRESSION: No acute intracranial abnormality. Right maxillary sinusitis. Electronically Signed   By: Jonna Clark M.D.   On: 04/19/2019 23:43    ____________________________________________   PROCEDURES  Procedure(s) performed:   Procedures   ____________________________________________   INITIAL IMPRESSION / ASSESSMENT AND PLAN / ED COURSE  Conversion disorder versus atypical panic attack as possible cause.  I doubt she has had a transient ischemic attack or CVA.   Here she is neuro intact without abnormalities on workup. No indication for further imaging.  Consider possible carotid or vertebral dissection as she did have some headache prior to the pain however expect the neuro deficit to be ongoing and also transient and intermittent in nature.  However follow-up with neurology for further management.     Pertinent labs & imaging results that were available during my care  of the patient were reviewed by me and considered in my medical decision making (see chart for details).   A medical screening exam was performed and I feel the patient has had an appropriate workup for their chief complaint at this time and likelihood of emergent condition existing is low. They have been counseled on decision, discharge, follow up and which symptoms necessitate immediate return to the emergency department. They or their family verbally stated understanding and agreement with plan and discharged in stable condition.   ____________________________________________  FINAL CLINICAL IMPRESSION(S) / ED DIAGNOSES  Final diagnoses:  Transient alteration of awareness     MEDICATIONS GIVEN DURING THIS VISIT:  Medications  sodium chloride flush (NS) 0.9 % injection 3 mL (has no administration in time range)     NEW OUTPATIENT MEDICATIONS STARTED DURING THIS VISIT:  Discharge Medication List as of  04/20/2019  4:16 AM      Note:  This note was prepared with assistance of Dragon voice recognition software. Occasional wrong-word or sound-a-like substitutions may have occurred due to the inherent limitations of voice recognition software.   Cutler Sunday, Corene Cornea, MD 04/20/19 [phone]

## 2019-04-21 LAB — URINE CULTURE

## 2019-05-25 ENCOUNTER — Ambulatory Visit (INDEPENDENT_AMBULATORY_CARE_PROVIDER_SITE_OTHER): Payer: Commercial Managed Care - PPO | Admitting: Neurology

## 2019-05-25 ENCOUNTER — Encounter: Payer: Self-pay | Admitting: Neurology

## 2019-05-25 ENCOUNTER — Other Ambulatory Visit: Payer: Self-pay

## 2019-05-25 VITALS — BP 134/81 | HR 84 | Temp 95.7°F | Ht 62.0 in | Wt 175.3 lb

## 2019-05-25 DIAGNOSIS — R2990 Unspecified symptoms and signs involving the nervous system: Secondary | ICD-10-CM | POA: Diagnosis not present

## 2019-05-25 DIAGNOSIS — H538 Other visual disturbances: Secondary | ICD-10-CM

## 2019-05-25 DIAGNOSIS — G43019 Migraine without aura, intractable, without status migrainosus: Secondary | ICD-10-CM | POA: Diagnosis not present

## 2019-05-25 DIAGNOSIS — G43109 Migraine with aura, not intractable, without status migrainosus: Secondary | ICD-10-CM

## 2019-05-25 MED ORDER — AMITRIPTYLINE HCL 25 MG PO TABS: ORAL_TABLET | ORAL | 3 refills | Status: DC

## 2019-05-25 NOTE — Patient Instructions (Addendum)
I believe you have recurrent migraines, you may have had what we call a complicated migraine when you had difficulty talking.  Your head CT was without any obvious abnormality and your neurological exam is normal which is reassuring.  I would recommend proceeding with an ultrasound of your neck arteries to make sure there is no significant impairment of your blood flow.   I would also recommend that you get a formal eye examination done through an optometrist or ophthalmologist of your choice.  For migraine prevention, I recommend Elavil (generic name: amitriptyline) 25 mg: Take half a pill daily at bedtime for one week, then one pill daily at bedtime for one week, then one and a half pills daily at bedtime for one week, then 2 pills daily at bedtime thereafter. Common side effects reported are: mouth dryness, drowsiness, confusion, dizziness.  We may consider a sleep study down the road, as untreated obstructive sleep apnea may cause recurrent headaches.

## 2019-05-25 NOTE — Progress Notes (Signed)
Subjective:    Patient ID: Hayley Lawson is a 24 y.o. female.  HPI     Huston Foley, MD, PhD [facility] [address] [address] [place], [place] [address]  I saw patient, Hayley Lawson, as a referral from the emergency room for aphasia.  The patient is accompanied by her fianc today.  She is a 24 year old right-handed woman with a underlying history of obesity, who presented to the emergency room on 04/19/2019 with approximately 3-hour episode of intermittent difficulty speaking.  She had inability to speak off and on which lasted altogether about 3 hours.  In the emergency room, she had a nonfocal examination.  She had reported some shortness of breath and left-sided cheek pain and also some headache at the time.  I reviewed the emergency room records.  She had a head CT without contrast on 04/19/2019 and I reviewed the results: IMPRESSION: No acute intracranial abnormality.   Right maxillary sinusitis.   She reports intermittent one-sided headaches, they are often associated with blurry vision and nausea, typically no vomiting.  She has more headaches around her menstrual period.  She has noticed an increase in her headache frequency to up to 2/week, sometimes more than that and has taken as needed BC powder but tries to avoid taking it daily because of the caffeine content.  She does report drinking caffeine in the form of coffee, 2 to 3 cups/day on average, she tries to hydrate well with water, about 3 bottles a day on average.  She lives with her fianc and her 3 children, ages 73, 86 and 2.  She does not always sleep very well.  She is a light sleeper and attributes this to having small children.  Sometimes the 44-year-old does not sleep through the night.  She snores but does not have any pauses in her breathing, she has woken up with the occasional headache, has nocturia about once per average night.  She has no witnessed apneas per fianc's report.  She has  a family history of migraines.  She has not been on any migraine preventative medication.  She has not had any similar episode with speech difficulty.  She has not had any one-sided weakness or numbness.  She still has recurrent headaches though.  She has not had a formal eye examination in years.  Her Past Medical History Is Significant For: Past Medical History:  Diagnosis Date  . Gestational diabetes     Her Past Surgical History Is Significant For: Past Surgical History:  Procedure Laterality Date  . NO PAST SURGERIES      Her Family History Is Significant For: Family History  Problem Relation Age of Onset  . Stroke Father   . Depression Father   . Anxiety disorder Father   . Diabetes Father   . Alcohol abuse Neg Hx   . Arthritis Neg Hx   . Asthma Neg Hx   . Birth defects Neg Hx   . COPD Neg Hx   . Cancer Neg Hx   . Drug abuse Neg Hx   . Early death Neg Hx   . Hearing loss Neg Hx   . Heart disease Neg Hx   . Hyperlipidemia Neg Hx   . Hypertension Neg Hx   . Kidney disease Neg Hx   . Learning disabilities Neg Hx   . Mental illness Neg Hx   . Mental retardation Neg Hx   . Miscarriages / Stillbirths Neg Hx   . Vision  loss Neg Hx   . Varicose Veins Neg Hx     Her Social History Is Significant For: Social History   Socioeconomic History  . Marital status: Single    Spouse name: Not on file  . Number of children: Not on file  . Years of education: Not on file  . Highest education level: Not on file  Occupational History  . Not on file  Tobacco Use  . Smoking status: Former Research scientist (life sciences)  . Smokeless tobacco: Never Used  Substance and Sexual Activity  . Alcohol use: No  . Drug use: No  . Sexual activity: Yes    Birth control/protection: None  Other Topics Concern  . Not on file  Social History Narrative  . Not on file   Social Determinants of Health   Financial Resource Strain:   . Difficulty of Paying Living Expenses: Not on file  Food Insecurity:   .  Worried About Charity fundraiser in the Last Year: Not on file  . Ran Out of Food in the Last Year: Not on file  Transportation Needs:   . Lack of Transportation (Medical): Not on file  . Lack of Transportation (Non-Medical): Not on file  Physical Activity:   . Days of Exercise per Week: Not on file  . Minutes of Exercise per Session: Not on file  Stress:   . Feeling of Stress : Not on file  Social Connections:   . Frequency of Communication with Friends and Family: Not on file  . Frequency of Social Gatherings with Friends and Family: Not on file  . Attends Religious Services: Not on file  . Active Member of Clubs or Organizations: Not on file  . Attends Archivist Meetings: Not on file  . Marital Status: Not on file    Her Allergies Are:  Allergies  Allergen Reactions  . Bactrim [Sulfamethoxazole-Trimethoprim] Rash  :   Her Current Medications Are:  Outpatient Encounter Medications as of 05/25/2019  Medication Sig  . amitriptyline (ELAVIL) 25 MG tablet 1/2 pill each bedtime x 1 week, then 1 pill nightly x 1 week, then 1 1/2 pills nightly x 1 week, then 2 pills nightly thereafter.  . [DISCONTINUED] cephALEXin (KEFLEX) 500 MG capsule Take 1 capsule (500 mg total) by mouth 4 (four) times daily.  . [DISCONTINUED] famotidine (PEPCID) 20 MG tablet Take 1 tablet (20 mg total) by mouth 2 (two) times daily.   No facility-administered encounter medications on file as of 05/25/2019.  :   Review of Systems:  Out of a complete 14 point review of systems, all are reviewed and negative with the exception of these symptoms as listed below:  Review of Systems  Neurological:       Possible TIA, here fore neurology work up.     Objective:  Neurological Exam  Physical Exam Physical Examination:   Vitals:   05/25/19 1432  BP: 134/81  Pulse: 84  Temp: (!) 95.7 F (35.4 C)    General Examination: The patient is a very pleasant 24 y.o. female in no acute distress. She  appears well-developed and well-nourished and well groomed.   HEENT: Normocephalic, atraumatic, pupils are equal, round and reactive to light and accommodation. Funduscopic exam is normal with sharp disc margins noted. Extraocular tracking is good without limitation to gaze excursion or nystagmus noted. Normal smooth pursuit is noted. Hearing is grossly intact.ar bilaterally. Face is symmetric with normal facial animation and normal facial sensation. Speech is clear with no dysarthria  noted. There is no hypophonia. There is no lip, neck/head, jaw or voice tremor. Neck is supple with full range of passive and active motion. There are no carotid bruits on auscultation. Oropharynx exam reveals: mild mouth dryness, good dental hygiene and mild airway crowding, due to Smaller airway entry and tonsillar size of 1-2+.  Mallampati is class I.  Tongue protrudes centrally and palate elevates symmetrically.   Chest: Clear to auscultation without wheezing, rhonchi or crackles noted.  Heart: S1+S2+0, regular and normal without murmurs, rubs or gallops noted.   Abdomen: Soft, non-tender and non-distended with normal bowel sounds appreciated on auscultation.  Extremities: There is no pitting edema in the distal lower extremities bilaterally. Pedal pulses are intact.  Skin: Warm and dry without trophic changes noted.  Musculoskeletal: exam reveals no obvious joint deformities, tenderness or joint swelling or erythema.   Neurologically:  Mental status: The patient is awake, alert and oriented in all 4 spheres. Her immediate and remote memory, attention, language skills and fund of knowledge are appropriate. There is no evidence of aphasia, agnosia, apraxia or anomia. Speech is clear with normal prosody and enunciation. Thought process is linear. Mood is normal and affect is normal.  Cranial nerves II - XII are as described above under HEENT exam. In addition: shoulder shrug is normal with equal shoulder height  noted. Motor exam: Normal bulk, strength and tone is noted. There is no drift, tremor or rebound. Romberg is negative. Reflexes are 2+ throughout. Babinski: Toes are flexor bilaterally. Fine motor skills and coordination: intact with normal finger taps, normal hand movements, normal rapid alternating patting, normal foot taps and normal foot agility.  Cerebellar testing: No dysmetria or intention tremor on finger to nose testing. Heel to shin is unremarkable bilaterally. There is no truncal or gait ataxia.  Sensory exam: intact to light touch, vibration, temperature sense in the upper and lower extremities.  Gait, station and balance: She stands easily. No veering to one side is noted. No leaning to one side is noted. Posture is age-appropriate and stance is narrow based. Toes tend to point outwards bilaterally.  Gait shows normal stride length and normal pace. No problems turning are noted. tandem walk is unremarkable.   Assessment and Plan:  In summary, Hayley Lawson is a very pleasant 24 y.o.-year old female With an underlying medical history of recurrent headaches and obesity, who presents as a referral from the ER for evaluation of her recent episode of aphasia, associated with headache, she may have had a complex migraine.  She does report intermittent migraine headaches and feels that these have become worse over time.  She is cautioned regarding the regular use of BC powder because of side effects and perpetuating headaches as well as the caffeine content.  She tries to be mindful of this.  She is advised to proceed with further testing in the form of a carotid Doppler ultrasound to make sure there is no significant blood flow problem.  Her CT head in the emergency room was benign, neurological exam is nonfocal and she is largely reassured.  She has had fairly frequent migrainous headaches. She will likely benefit from migraine prevention.  We talked about abortive and preventative treatment.  She  is advised to start with amitriptyline 25 mg strength half a pill at night with gradual titration.  I talked to her about expectations, potential side effects and limitations of the medication.  She was given written instructions and a new prescription as well. We may consider  a sleep study down the road as she does snore and has a mildly crowded airway, also has mild obesity which may be a risk factor for underlying sleep disordered breathing.  She is advised to follow-up routinely with a nurse practitioner in 3 months, sooner if needed, we will call her in the interim with her carotid Doppler test results.  She is encouraged to seek a formal eye examination as she has not had an eye exam in several years and Reports intermittent blurry vision, likely in the context of a migraine.  She declines a prescription for as needed nausea medication such as Zofran at this time. I answered all their questions today and the patient and her fianc were in agreement with the plan. Huston Foley, MD, PhD

## 2019-08-24 ENCOUNTER — Ambulatory Visit: Payer: Commercial Managed Care - PPO | Admitting: Family Medicine

## 2019-08-24 ENCOUNTER — Encounter: Payer: Self-pay | Admitting: Family Medicine

## 2020-09-22 ENCOUNTER — Other Ambulatory Visit: Payer: Self-pay | Admitting: Internal Medicine

## 2020-09-23 LAB — URINE CULTURE
MICRO NUMBER:: 11938778
SPECIMEN QUALITY:: ADEQUATE

## 2020-11-01 ENCOUNTER — Other Ambulatory Visit: Payer: Self-pay

## 2020-11-01 ENCOUNTER — Encounter: Payer: Medicaid Other | Admitting: Obstetrics and Gynecology

## 2021-08-13 ENCOUNTER — Emergency Department (HOSPITAL_BASED_OUTPATIENT_CLINIC_OR_DEPARTMENT_OTHER)
Admission: EM | Admit: 2021-08-13 | Discharge: 2021-08-13 | Disposition: A | Discharge status: Home / Self Care | Payer: Medicaid Other | Attending: Emergency Medicine | Admitting: Emergency Medicine

## 2021-08-13 ENCOUNTER — Other Ambulatory Visit: Payer: Self-pay

## 2021-08-13 ENCOUNTER — Emergency Department (HOSPITAL_BASED_OUTPATIENT_CLINIC_OR_DEPARTMENT_OTHER): Payer: Medicaid Other

## 2021-08-13 ENCOUNTER — Encounter (HOSPITAL_BASED_OUTPATIENT_CLINIC_OR_DEPARTMENT_OTHER): Payer: Self-pay | Admitting: Emergency Medicine

## 2021-08-13 DIAGNOSIS — R519 Headache, unspecified: Secondary | ICD-10-CM

## 2021-08-13 LAB — PREGNANCY, URINE: Preg Test, Ur: NEGATIVE

## 2021-08-13 MED ORDER — METOCLOPRAMIDE HCL 5 MG/ML IJ SOLN
10.0000 mg | Freq: Once | INTRAMUSCULAR | Status: AC
  Administered 2021-08-13: 10 mg via INTRAVENOUS
  Filled 2021-08-13: qty 2

## 2021-08-13 MED ORDER — DIPHENHYDRAMINE HCL 50 MG/ML IJ SOLN
50.0000 mg | Freq: Once | INTRAMUSCULAR | Status: AC
  Administered 2021-08-13: 50 mg via INTRAVENOUS
  Filled 2021-08-13: qty 1

## 2021-08-13 NOTE — ED Provider Notes (Signed)
?[facility] EMERGENCY DEPT ?Provider Note ? ? ?CSN: [id number] ?Arrival date & time: 08/13/21  1446 ? ?  ? ?History ? ?Chief Complaint  ?Patient presents with  ? Migraine  ? ? ?Hayley Lawson is a 26 y.o. female with reported history of migraine headaches.  Presents emergency department for complaint of headache with visual disturbances.  Patient states that she has been dealing with migraines for approximately the last year.  Patient states that she did see a neurologist but cannot remember who she saw and states that they did not give her any prescriptions. ? ?Patient states that she has had a migraine for the last month.  Migraine has been constant over the last week.  Pain is located to her occipital region and frontal temporal aspect of her head.  She describes the pain as a pressure with intermittent thumping."  Patient rates her pain 4/10 the pain scale at present.  Patient states that she has been having nausea and vomiting associated with her migraine headache.  Additionally patient reports that she has been having episodes of vision loss and double vision.  Patient states that the symptoms will come on randomly usually when her headache is intensifying.  Patient states that the symptoms last for approximately 2 minutes or less and then spontaneously resolved.  Patient says that both eyes are affected. ? ?Patient denies any recent falls or traumatic injuries.  Denies any illicit drug use or alcohol use. ? ?Patient denies any facial asymmetry, slurred speech, numbness, weakness, neck pain, neck stiffness, fever, chills, chest pain, shortness of breath. ? ? ?Migraine ?Associated symptoms include headaches. Pertinent negatives include no chest pain, no abdominal pain and no shortness of breath.  ? ?  ? ?Home Medications ?Prior to Admission medications   ?Medication Sig Start Date End Date Taking? Authorizing Provider  ?amitriptyline (ELAVIL) 25 MG tablet 1/2 pill each bedtime x 1 week, then 1 pill  nightly x 1 week, then 1 1/2 pills nightly x 1 week, then 2 pills nightly thereafter. 05/25/19   Huston Foley, MD  ?   ? ?Allergies    ?Bactrim [sulfamethoxazole-trimethoprim]   ? ?Review of Systems   ?Review of Systems  ?Constitutional:  Negative for chills and fever.  ?HENT:  Negative for facial swelling.   ?Eyes:  Positive for photophobia and visual disturbance.  ?Respiratory:  Negative for shortness of breath.   ?Cardiovascular:  Negative for chest pain.  ?Gastrointestinal:  Positive for nausea and vomiting. Negative for abdominal pain and diarrhea.  ?Genitourinary:  Negative for difficulty urinating, dysuria, hematuria and urgency.  ?Musculoskeletal:  Negative for back pain, neck pain and neck stiffness.  ?Skin:  Negative for color change and rash.  ?Neurological:  Positive for headaches. Negative for dizziness, tremors, seizures, syncope, facial asymmetry, speech difficulty, weakness, light-headedness and numbness.  ?Psychiatric/Behavioral:  Negative for confusion.   ? ?Physical Exam ?Updated Vital Signs ?BP 124/81   Pulse 82   Temp 98 ?F (36.7 ?C)   Resp 16   Ht 5\' 2"  (1.575 m)   Wt 70.8 kg   LMP 07/27/2021   SpO2 100%   Breastfeeding No   BMI 28.53 kg/m?  ?Physical Exam ?Vitals and nursing note reviewed.  ?Constitutional:   ?   General: She is not in acute distress. ?   Appearance: She is not ill-appearing, toxic-appearing or diaphoretic.  ?HENT:  ?   Head: Normocephalic and atraumatic.  ?Eyes:  ?   General: No scleral icterus.    ?  Right eye: No discharge.     ?   Left eye: No discharge.  ?   Extraocular Movements: Extraocular movements intact.  ?   Conjunctiva/sclera: Conjunctivae normal.  ?   Pupils: Pupils are equal, round, and reactive to light.  ?Cardiovascular:  ?   Rate and Rhythm: Normal rate.  ?Pulmonary:  ?   Effort: Pulmonary effort is normal.  ?Musculoskeletal:  ?   Cervical back: Normal range of motion and neck supple. No rigidity.  ?Skin: ?   General: Skin is warm and dry.   ?Neurological:  ?   General: No focal deficit present.  ?   Mental Status: She is alert and oriented to person, place, and time.  ?   GCS: GCS eye subscore is 4. GCS verbal subscore is 5. GCS motor subscore is 6.  ?   Cranial Nerves: No cranial nerve deficit or facial asymmetry.  ?   Sensory: Sensation is intact.  ?   Motor: No weakness, tremor, seizure activity or pronator drift.  ?   Coordination: Romberg sign negative. Finger-Nose-Finger Test normal.  ?   Gait: Gait is intact. Gait normal.  ?   Comments: CN II-XII intact, equal grip strength, +5 strength to bilateral upper and lower extremities, sensation to light touch grossly intact to bilateral upper and lower extremities.  ?Psychiatric:     ?   Behavior: Behavior is cooperative.  ? ? ?ED Results / Procedures / Treatments   ?Labs ?(all labs ordered are listed, but only abnormal results are displayed) ?Labs Reviewed - No data to display ? ?EKG ?None ? ?Radiology ?CT Head Wo Contrast ? ?Result Date: 08/13/2021 ?CLINICAL DATA:  Migraine headache for the past month, now with double vision, dizziness, and emesis. EXAM: CT HEAD WITHOUT CONTRAST TECHNIQUE: Contiguous axial images were obtained from the base of the skull through the vertex without intravenous contrast. RADIATION DOSE REDUCTION: This exam was performed according to the departmental dose-optimization program which includes automated exposure control, adjustment of the mA and/or kV according to patient size and/or use of iterative reconstruction technique. COMPARISON:  CT head dated April 19, 2019. FINDINGS: Brain: No evidence of acute infarction, hemorrhage, hydrocephalus, extra-axial collection or mass lesion/mass effect. Vascular: No hyperdense vessel or unexpected calcification. Skull: Normal. Negative for fracture or focal lesion. Sinuses/Orbits: No acute finding. 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 Other: None. IMPRESSION: 1. No acute intracranial abnormality.  Electronically Signed   By: Obie Dredge M.D.   On: 08/13/2021 20:14   ? ?Procedures ?Procedures  ? ? ?Medications Ordered in ED ?Medications  ?metoCLOPramide (REGLAN) injection 10 mg (10 mg Intravenous Given 08/13/21 1935)  ?diphenhydrAMINE (BENADRYL) injection 50 mg (50 mg Intravenous Given 08/13/21 1934)  ? ? ?ED Course/ Medical Decision Making/ A&P ?  ?                        ?Medical Decision Making ?Amount and/or Complexity of Data Reviewed ?Labs: ordered. ?Radiology: ordered. ? ?Risk ?Prescription drug management. ? ? ?Alert 26 year old female in no acute distress, nontoxic-appearing.  Presents the emergency department with a complaint of migraine headache. ? ?Information obtained from patient.  Past medical records were reviewed including previous provider notes, labs, and imaging.  Patient has medical history as outlined in HPI with complicates her care. ? ?Patient reports visual disturbances with intensifying migraine headache.  Suspect complex migraine.  Neuro exam is reassuring at this time.  Patient denies any visual disturbance  at this time.  Will obtain CT imaging of head to evaluate for intracranial mass causing patient's symptoms.  Patient given migraine cocktail. ? ?I personally viewed interpretations CT imaging.  Agree with radiology interpretation of no acute intracranial abnormality. ? ?Patient reports improvement in pain after receiving migraine cocktail.  Patient continues to have no focal neurological deficits or visual disturbance.  Will discharge patient at this time to follow-up with neurology in the outpatient setting for further management of her migraine headaches. ? ?Patient was discussed with and evaluated by Dr. Stevie Kernykstra. ? ?Based on patient's chief complaint, I considered admission might be necessary, however after reassuring ED workup feel patient is reasonable for discharge.  Discussed results, findings, treatment and follow up. Patient advised of return precautions. Patient  verbalized understanding and agreed with plan. ? ?Portions of this note were generated with Scientist, clinical (histocompatibility and immunogenetics)Dragon dictation software. Dictation errors may occur despite best attempts at proofreading. ? ? ? ? ? ? ? ? ? ?Final Clinical Impression(s) /

## 2021-08-13 NOTE — ED Triage Notes (Signed)
Pt c/o migraine headache for 1 month. Pt now having emesis, double vision and dizziness. Pt seen at urgent care for same on Friday.  ?

## 2021-08-13 NOTE — Discharge Instructions (Signed)
Drink plenty of fluids, take Tylenol or Motrin for pain control.  Follow-up with your primary doctor and neurology specialist regarding your headaches. ?

## 2021-08-13 NOTE — ED Notes (Signed)
Patient transported to CT 

## 2021-08-17 ENCOUNTER — Ambulatory Visit: Payer: Medicaid Other | Admitting: Neurology

## 2021-08-17 ENCOUNTER — Encounter: Payer: Self-pay | Admitting: Neurology

## 2021-08-17 VITALS — BP 114/74 | HR 87 | Ht 62.0 in | Wt 161.4 lb

## 2021-08-17 DIAGNOSIS — G4440 Drug-induced headache, not elsewhere classified, not intractable: Secondary | ICD-10-CM

## 2021-08-17 DIAGNOSIS — G43019 Migraine without aura, intractable, without status migrainosus: Secondary | ICD-10-CM | POA: Diagnosis not present

## 2021-08-17 MED ORDER — AMITRIPTYLINE HCL 25 MG PO TABS: ORAL_TABLET | ORAL | 3 refills | Status: DC

## 2021-08-17 MED ORDER — ONDANSETRON HCL 4 MG PO TABS: 4.0000 mg | ORAL_TABLET | Freq: Three times a day (TID) | ORAL | 0 refills | Status: DC | PRN

## 2021-08-17 MED ORDER — RIZATRIPTAN BENZOATE 5 MG PO TBDP: 5.0000 mg | ORAL_TABLET | ORAL | 3 refills | Status: DC | PRN

## 2021-08-17 NOTE — Progress Notes (Signed)
Subjective:  ?  ?Patient ID: Hayley Lawson is a 26 y.o. female. ? ?HPI ? ? ? ?Huston Foley, MD, PhD ?[facility] ?[address], [address] ?[phone] ?[place], [place] [address] ? ?I saw patient, Hayley Lawson, as a referral from the emergency room for concern for migraines.  The patient is unaccompanied today.  Hayley Lawson is a 26 year old right-handed woman with an underlying medical history of gestational diabetes, and overweight state, who reports increase in her migraines over the past month, she has nearly daily headaches associated with nausea, typically no vomiting, she has light sensitivity and blurry vision, sometimes loss of vision briefly.  She has not had a recent eye examination.  We had talked about evaluation with an eye exam a couple of years ago, she did not pursue it, she has not tried amitriptyline I prescribed a couple of years ago.  She is not sure if she even filled it.  She has a family history of migraines.  She drinks caffeine in the form of coffee, 2 cups/day, tries to hydrate well with water, estimates that she drinks about 2-3 bottles per day, 16.9 ounce size each.  She is a non-smoker, does not drink alcohol currently.  She has tried Zofran in the past for nausea.  She denies any sudden onset of one-sided weakness or numbness or tingling or droopy face or slurring of speech.  She presented to the emergency room on 08/13/2021 and I reviewed the MRI records.  He presented a history of recurrent headaches, she had visual disturbance.  She had a head CT without contrast on 08/13/2021 and I reviewed the results: IMPRESSION: ?1. No acute intracranial abnormality.  She was treated symptomatically with Reglan, and Benadryl. ? ?I had evaluated her in 2021 for an episode of aphasia and she also reported recurrent headaches.  She was advised to get a formal eye examination done.  I started her on amitriptyline for migraine prevention.  She was advised to stop using BC powder  regularly.  We talked about medication overuse headaches.  We talked about potentially proceeding with a sleep study.  She was advised to proceed with a carotid Doppler ultrasound.  She did not have it done.  She no showed for her follow-up appointment in April 2021. ? ? ?Previously:  ? ?05/25/19: 26 year old right-handed woman with a underlying history of obesity, who presented to the emergency room on 04/19/2019 with approximately 3-hour episode of intermittent difficulty speaking.  She had inability to speak off and on which lasted altogether about 3 hours.  In the emergency room, she had a nonfocal examination.  She had reported some shortness of breath and left-sided cheek pain and also some headache at the time.  I reviewed the emergency room records.  She had a head CT without contrast on 04/19/2019 and I reviewed the results: IMPRESSION: ?No acute intracranial abnormality. ?  ?Right maxillary sinusitis. ?  ?She reports intermittent one-sided headaches, they are often associated with blurry vision and nausea, typically no vomiting.  She has more headaches around her menstrual period.  She has noticed an increase in her headache frequency to up to 2/week, sometimes more than that and has taken as needed BC powder but tries to avoid taking it daily because of the caffeine content.  She does report drinking caffeine in the form of coffee, 2 to 3 cups/day on average, she tries to hydrate well with water, about 3 bottles a day on average.  She lives with her  fianc? and her 3 children, ages 88, 3 and 2.  She does not always sleep very well.  She is a light sleeper and attributes this to having small children.  Sometimes the 69-year-old does not sleep through the night.  She snores but does not have any pauses in her breathing, she has woken up with the occasional headache, has nocturia about once per average night.  She has no witnessed apneas per fianc?'s report.  She has a family history of migraines.  She has not  been on any migraine preventative medication.  She has not had any similar episode with speech difficulty.  She has not had any one-sided weakness or numbness.  She still has recurrent headaches though.  She has not had a formal eye examination in years. ? ?Her Past Medical History Is Significant For: ?Past Medical History:  ?Diagnosis Date  ? Gestational diabetes   ? ? ?Her Past Surgical History Is Significant For: ?Past Surgical History:  ?Procedure Laterality Date  ? NO PAST SURGERIES    ? ? ?Her Family History Is Significant For: ?Family History  ?Problem Relation Age of Onset  ? Migraines Mother   ? Stroke Father   ? Depression Father   ? Anxiety disorder Father   ? Diabetes Father   ? Alcohol abuse Neg Hx   ? Arthritis Neg Hx   ? Asthma Neg Hx   ? Birth defects Neg Hx   ? COPD Neg Hx   ? Cancer Neg Hx   ? Drug abuse Neg Hx   ? Early death Neg Hx   ? Hearing loss Neg Hx   ? Heart disease Neg Hx   ? Hyperlipidemia Neg Hx   ? Hypertension Neg Hx   ? Kidney disease Neg Hx   ? Learning disabilities Neg Hx   ? Mental illness Neg Hx   ? Mental retardation Neg Hx   ? Miscarriages / Stillbirths Neg Hx   ? Vision loss Neg Hx   ? Varicose Veins Neg Hx   ? ? ?Her Social History Is Significant For: ?Social History  ? ?Socioeconomic History  ? Marital status: Single  ?  Spouse name: Not on file  ? Number of children: Not on file  ? Years of education: Not on file  ? Highest education level: Not on file  ?Occupational History  ? Not on file  ?Tobacco Use  ? Smoking status: Former  ? Smokeless tobacco: Never  ?Vaping Use  ? Vaping Use: Never used  ?Substance and Sexual Activity  ? Alcohol use: No  ? Drug use: No  ? Sexual activity: Yes  ?  Birth control/protection: None  ?Other Topics Concern  ? Not on file  ?Social History Narrative  ? Not on file  ? ?Social Determinants of Health  ? ?Financial Resource Strain: Not on file  ?Food Insecurity: Not on file  ?Transportation Needs: Not on file  ?Physical Activity: Not on file   ?Stress: Not on file  ?Social Connections: Not on file  ? ? ?Her Allergies Are:  ?Allergies  ?Allergen Reactions  ? Bactrim [Sulfamethoxazole-Trimethoprim] Rash  ?:  ? ?Her Current Medications Are:  ?Outpatient Encounter Medications as of 08/17/2021  ?Medication Sig  ? levonorgestrel (MIRENA) 20 MCG/DAY IUD by Intrauterine route.  ? VYVANSE 20 MG capsule Take 20 mg by mouth every morning.  ? amitriptyline (ELAVIL) 25 MG tablet 1/2 pill each bedtime x 1 week, then 1 pill nightly x 1 week, then 1 1/2  pills nightly x 1 week, then 2 pills nightly thereafter.  ? amphetamine-dextroamphetamine (ADDERALL XR) 10 MG 24 hr capsule  (Patient not taking: Reported on 08/17/2021)  ? ?No facility-administered encounter medications on file as of 08/17/2021.  ?: ? ? ?Review of Systems:  ?Out of a complete 14 point review of systems, all are reviewed and negative with the exception of these symptoms as listed below:  ? ?Review of Systems  ?Neurological:   ?     Pt is here for increased migraines. Pt states in the last month migraine more frequently.Pt states the only thing she takes for migraines is BC powder.. Pt states she had  double vision,black outs ,aura and dizziness, and vomiting. Pt states she saw black for a split second  ?  ? ?Objective:  ?Neurological Exam ? ?Physical Exam ?Physical Examination:  ? ?Vitals:  ? 08/17/21 1428  ?BP: 114/74  ?Pulse: 87  ? ? ?General Examination: The patient is a very pleasant 26 y.o. female in no acute distress. She appears well-developed and well-nourished and well groomed.  ? ?HEENT: Normocephalic, atraumatic, pupils are equal, round and reactive to light, mildly sensitive to light but funduscopic exam doable and benign.  Extraocular tracking without limitation, hearing grossly intact, face is symmetric with normal facial animation, speech is clear without dysarthria, hypophonia or voice tremor.  No carotid bruits.  Airway examination reveals stable findings, tongue protrudes centrally and  palate elevates symmetrically.   ?  ?Chest: Clear to auscultation without wheezing, rhonchi or crackles noted. ?  ?Heart: S1+S2+0, regular and normal without murmurs, rubs or gallops noted.  ?  ?Abdomen: Soft,

## 2021-08-17 NOTE — Patient Instructions (Addendum)
We will start for migraine prevention, Elavil (generic name: amitriptyline) 25 mg: Take half a pill daily at bedtime for one week, then one pill daily at bedtime for one week, then one and a half pills daily at bedtime for one week, then 2 pills daily at bedtime thereafter. Common side effects reported are: mouth dryness, drowsiness, confusion, dizziness.  ?We will use for the acute migraine, Maxalt orally disintegrating tab, 5 mg: take 1 pill early on when you suspect a migraine attack come on. You may take another pill within 2 hours, no more than 2 pills in 24 hours. Most people who take triptans do not have any serious side-effects. However, they can cause drowsiness (remember to not drive or use heavy machinery when drowsy), nausea, dizziness, dry mouth. Less common side effects include strange sensations, such as tightness in your chest or throat, tingling, flushing, and feelings of heaviness or pressure in areas such as the face, limbs, and chest. These in the chest can mimic heart related pain (angina) and may cause alarm, but usually these sensations are not harmful or a sign of a heart attack. However, if you develop intense chest pain or sensations of discomfort, you should stop taking your medication and consult with me or your PCP or go to the nearest urgent care facility or ER or call 911.  ?We will do a brain scan, called MRI and call you with the test results. We will have to schedule you for this on a separate date. This test requires authorization from your insurance, and we will take care of the insurance process. ?I have sent a prescription for Zofran as needed for nausea as well. ?

## 2021-08-18 LAB — COMPREHENSIVE METABOLIC PANEL
ALT: 31 IU/L (ref 0–32)
AST: 21 IU/L (ref 0–40)
Albumin/Globulin Ratio: 1.7 (ref 1.2–2.2)
Albumin: 4.3 g/dL (ref 3.9–5.0)
Alkaline Phosphatase: 57 IU/L (ref 44–121)
BUN/Creatinine Ratio: 13 (ref 9–23)
BUN: 9 mg/dL (ref 6–20)
Bilirubin Total: 0.5 mg/dL (ref 0.0–1.2)
CO2: 23 mmol/L (ref 20–29)
Calcium: 9.5 mg/dL (ref 8.7–10.2)
Chloride: 103 mmol/L (ref 96–106)
Creatinine, Ser: 0.67 mg/dL (ref 0.57–1.00)
Globulin, Total: 2.6 g/dL (ref 1.5–4.5)
Glucose: 76 mg/dL (ref 70–99)
Potassium: 4.5 mmol/L (ref 3.5–5.2)
Sodium: 139 mmol/L (ref 134–144)
Total Protein: 6.9 g/dL (ref 6.0–8.5)
eGFR: 123 mL/min/{1.73_m2} (ref >59)

## 2021-08-23 ENCOUNTER — Telehealth: Payer: Self-pay | Admitting: Neurology

## 2021-08-23 NOTE — Telephone Encounter (Signed)
[facility] pending faxed notes  ?

## 2021-08-23 NOTE — Telephone Encounter (Signed)
[facility] Josem KaufmannKD:[id number] (exp. 08/23/21 to 09/22/21) order sent to GI. They will reach out to the patient to schedule.  ?

## 2021-08-24 ENCOUNTER — Ambulatory Visit (HOSPITAL_BASED_OUTPATIENT_CLINIC_OR_DEPARTMENT_OTHER): Payer: Medicaid Other | Admitting: Family Medicine

## 2021-09-05 ENCOUNTER — Ambulatory Visit (INDEPENDENT_AMBULATORY_CARE_PROVIDER_SITE_OTHER): Payer: Medicaid Other | Admitting: Family Medicine

## 2021-09-05 ENCOUNTER — Encounter (HOSPITAL_BASED_OUTPATIENT_CLINIC_OR_DEPARTMENT_OTHER): Payer: Self-pay | Admitting: Family Medicine

## 2021-09-05 DIAGNOSIS — Z7689 Persons encountering health services in other specified circumstances: Secondary | ICD-10-CM

## 2021-09-05 DIAGNOSIS — F909 Attention-deficit hyperactivity disorder, unspecified type: Secondary | ICD-10-CM | POA: Insufficient documentation

## 2021-09-05 DIAGNOSIS — Z0000 Encounter for general adult medical examination without abnormal findings: Secondary | ICD-10-CM

## 2021-09-05 DIAGNOSIS — G43909 Migraine, unspecified, not intractable, without status migrainosus: Secondary | ICD-10-CM | POA: Diagnosis not present

## 2021-09-05 NOTE — Assessment & Plan Note (Signed)
Recommend continuing treatment and evaluation as per neurology.  Recommend continuing with medications as discussed ?She does have MRI scheduled as per neurology recommendations ?Continue with scheduled follow-up ?

## 2021-09-05 NOTE — Assessment & Plan Note (Signed)
At this time, can continue with current medication which is Adderall 20 mg extended release once daily.  Discussed options with patient, may consider transitioning to shorter acting formulation and taking this twice daily to see if this better control symptoms for patient ?No refill needed at this time ?

## 2021-09-05 NOTE — Progress Notes (Signed)
? ?New Patient Office Visit ? ?Subjective   ? ?Patient ID: Hayley Lawson, female    DOB: 1996/04/27  Age: 26 y.o. MRN: [id number] ? ?CC:  ?Chief Complaint  ?Patient presents with  ? New Patient (Initial Visit)  ?  Bowel moment concerns started a few months ago   ? ? ?HPI ?Hayley Lawson presents to establish care ?Last PCP - last visit was about 2 months ago ? ?ADHD: Diagnosed about 4 years ago. Does report having symptoms as a child but her mom did not allow medications to be used for symptoms. At time of diagnosis, patient was started on Adderall - dosage has been adjusted at times, now on 20 mg XR.  ?Was a shortage on Adderall at one point and was started on Vyvanse but did not tolerate this medication ?Recalls being on shorter acting form of Adderall at 1 point and was taking this twice daily.  Was transition to extended release and feels as though this does not help to control symptoms as well throughout the day.  Would have some interest in transitioning back to a short acting form with twice daily dosing ? ?Migraines: Patient reports history of migraines and she recently established with a neurologist locally.  It appears that she did see this neurologist a couple years ago, but did not follow-up at that time.  At recent visit a few weeks ago, patient was recommended to start with amitriptyline with gradual titration of his medication.  She was also recommended to use Maxalt as needed for acute migraine attacks.  She has been arranged for an MRI which will be completed in the near future. ? ?Patient is originally from [place], [place]. Has been here for 4 years. Patient has an Scientist, forensic. Outside of work, patient enjoys traveling, spending time with family. ? ?Outpatient Encounter Medications as of 09/05/2021  ?Medication Sig  ? amphetamine-dextroamphetamine (ADDERALL XR) 10 MG 24 hr capsule   ? levonorgestrel (MIRENA) 20 MCG/DAY IUD by Intrauterine route.  ? amitriptyline (ELAVIL) 25 MG tablet FOLLOW  TITRATION INSTRUCTIONS PROVIDED SEPARATELY (Patient not taking: Reported on 09/05/2021)  ? ondansetron (ZOFRAN) 4 MG tablet Take 1 tablet (4 mg total) by mouth every 8 (eight) hours as needed for nausea or vomiting. (Patient not taking: Reported on 09/05/2021)  ? rizatriptan (MAXALT-MLT) 5 MG disintegrating tablet Take 1 tablet (5 mg total) by mouth as needed for migraine. May repeat in 2 hours if needed (Patient not taking: Reported on 09/05/2021)  ? [DISCONTINUED] VYVANSE 20 MG capsule Take 20 mg by mouth every morning.  ? ?No facility-administered encounter medications on file as of 09/05/2021.  ? ? ?Past Medical History:  ?Diagnosis Date  ? Gestational diabetes   ? ? ?Past Surgical History:  ?Procedure Laterality Date  ? NO PAST SURGERIES    ? ? ?Family History  ?Problem Relation Age of Onset  ? Migraines Mother   ? Stroke Father   ? Depression Father   ? Anxiety disorder Father   ? Diabetes Father   ? Alcohol abuse Neg Hx   ? Arthritis Neg Hx   ? Asthma Neg Hx   ? Birth defects Neg Hx   ? COPD Neg Hx   ? Cancer Neg Hx   ? Drug abuse Neg Hx   ? Early death Neg Hx   ? Hearing loss Neg Hx   ? Heart disease Neg Hx   ? Hyperlipidemia Neg Hx   ? Hypertension Neg Hx   ? Kidney disease Neg  Hx   ? Learning disabilities Neg Hx   ? Mental illness Neg Hx   ? Mental retardation Neg Hx   ? Miscarriages / Stillbirths Neg Hx   ? Vision loss Neg Hx   ? Varicose Veins Neg Hx   ? ? ?Social History  ? ?Socioeconomic History  ? Marital status: Single  ?  Spouse name: Not on file  ? Number of children: Not on file  ? Years of education: Not on file  ? Highest education level: Not on file  ?Occupational History  ? Not on file  ?Tobacco Use  ? Smoking status: Former  ? Smokeless tobacco: Never  ?Vaping Use  ? Vaping Use: Never used  ?Substance and Sexual Activity  ? Alcohol use: No  ? Drug use: No  ? Sexual activity: Yes  ?  Birth control/protection: None  ?Other Topics Concern  ? Not on file  ?Social History Narrative  ? Not on file   ? ?Social Determinants of Health  ? ?Financial Resource Strain: Not on file  ?Food Insecurity: Not on file  ?Transportation Needs: Not on file  ?Physical Activity: Not on file  ?Stress: Not on file  ?Social Connections: Not on file  ?Intimate Partner Violence: Not on file  ? ? ?Objective   ? ?BP 125/72   Pulse (!) 110   Ht 5\' 2"  (1.575 m)   Wt 157 lb 6.4 oz (71.4 kg)   SpO2 99%   BMI 28.79 kg/m?  ? ?Physical Exam ? ?26 year old female in no acute distress ?Cardiovascular exam with regular rate and rhythm, no murmur appreciated ?Lungs clear to auscultation bilaterally ? ?Assessment & Plan:  ? ?Problem List Items Addressed This Visit   ? ?  ? Cardiovascular and Mediastinum  ? Migraines  ?  Recommend continuing treatment and evaluation as per neurology.  Recommend continuing with medications as discussed ?She does have MRI scheduled as per neurology recommendations ?Continue with scheduled follow-up ? ?  ?  ?  ? Other  ? ADHD  ?  At this time, can continue with current medication which is Adderall 20 mg extended release once daily.  Discussed options with patient, may consider transitioning to shorter acting formulation and taking this twice daily to see if this better control symptoms for patient ?No refill needed at this time ? ?  ?  ? ?Other Visit Diagnoses   ? ? Encounter to establish care      ? Relevant Orders  ? Ambulatory referral to Obstetrics / Gynecology  ? Wellness examination      ? Relevant Orders  ? CBC with Differential/Platelet  ? Comprehensive metabolic panel  ? Lipid panel  ? TSH Rfx on Abnormal to Free T4  ? ?  ? ?At end of visit, patient also indicates that she has had some issues with bowel movements, primary concern is that for the past few months after bowel movement, she will "wipe until she is clean, but later she will be dirty again".  She denies any new issues of abdominal pain, she does report some varying constipation or diarrhea, but this is more of a chronic problem for her.   Denies any rectal bleeding or blood in the stool ? ?Return in about 3 months (around 12/06/2021) for CPE with [facility] a few days prior.  ? ?Twilia Yaklin J De 02/05/2022, MD ? ?

## 2021-09-06 ENCOUNTER — Ambulatory Visit
Admission: RE | Admit: 2021-09-06 | Discharge: 2021-09-06 | Disposition: A | Discharge status: Home / Self Care | Payer: Medicaid Other | Source: Ambulatory Visit | Attending: Neurology | Admitting: Neurology

## 2021-09-06 ENCOUNTER — Other Ambulatory Visit: Payer: Medicaid Other

## 2021-09-06 DIAGNOSIS — G43019 Migraine without aura, intractable, without status migrainosus: Secondary | ICD-10-CM | POA: Diagnosis not present

## 2021-09-06 MED ORDER — GADOBENATE DIMEGLUMINE 529 MG/ML IV SOLN
14.0000 mL | Freq: Once | INTRAVENOUS | Status: AC | PRN
  Administered 2021-09-06: 14 mL via INTRAVENOUS

## 2021-10-11 ENCOUNTER — Encounter: Payer: Medicaid Other | Admitting: Family Medicine

## 2021-10-30 ENCOUNTER — Ambulatory Visit (HOSPITAL_BASED_OUTPATIENT_CLINIC_OR_DEPARTMENT_OTHER): Payer: Medicaid Other

## 2021-10-30 ENCOUNTER — Encounter (HOSPITAL_BASED_OUTPATIENT_CLINIC_OR_DEPARTMENT_OTHER): Payer: Self-pay

## 2021-11-08 ENCOUNTER — Encounter (HOSPITAL_BASED_OUTPATIENT_CLINIC_OR_DEPARTMENT_OTHER): Payer: Self-pay | Admitting: Family Medicine

## 2021-11-08 ENCOUNTER — Ambulatory Visit (INDEPENDENT_AMBULATORY_CARE_PROVIDER_SITE_OTHER): Payer: Medicaid Other | Admitting: Family Medicine

## 2021-11-08 DIAGNOSIS — Z0000 Encounter for general adult medical examination without abnormal findings: Secondary | ICD-10-CM | POA: Insufficient documentation

## 2021-11-08 DIAGNOSIS — F909 Attention-deficit hyperactivity disorder, unspecified type: Secondary | ICD-10-CM

## 2021-11-08 MED ORDER — AMPHETAMINE-DEXTROAMPHETAMINE 10 MG PO TABS: 10.0000 mg | ORAL_TABLET | Freq: Two times a day (BID) | ORAL | 0 refills | Status: DC

## 2021-11-08 NOTE — Patient Instructions (Signed)
  Medication Instructions:  Your physician recommends that you continue on your current medications as directed. Please refer to the Current Medication list given to you today. --If you need a refill on any your medications before your next appointment, please call your pharmacy first. If no refills are authorized on file call the office.-- Lab Work: Your physician has recommended that you have lab work today: Yes If you have labs (blood work) drawn today and your tests are completely normal, you will receive your results via MyChart message OR a phone call from our staff.  Please ensure you check your voicemail in the event that you authorized detailed messages to be left on a delegated number. If you have any lab test that is abnormal or we need to change your treatment, we will call you to review the results.  Referrals/Procedures/Imaging: No  Follow-Up: Your next appointment:   Your physician recommends that you schedule a follow-up appointment in: 1 month with Dr. de Cuba.  You will receive a text message or e-mail with a link to a survey about your care and experience with us today! We would greatly appreciate your feedback!   Thanks for letting us be apart of your health journey!!  Primary Care and Sports Medicine   Dr. Raymond de Cuba   We encourage you to activate your patient portal called "MyChart".  Sign up information is provided on this After Visit Summary.  MyChart is used to connect with patients for Virtual Visits (Telemedicine).  Patients are able to view lab/test results, encounter notes, upcoming appointments, etc.  Non-urgent messages can be sent to your provider as well. To learn more about what you can do with MyChart, please visit --  [email or web address].    

## 2021-11-08 NOTE — Progress Notes (Signed)
Subjective:    CC: Annual Physical Exam  HPI:  Hayley Lawson is a 26 y.o. presenting for annual physical  I reviewed the past medical history, family history, social history, surgical history, and allergies today and no changes were needed.  Please see the problem list section below in epic for further details.  Past Medical History: Past Medical History:  Diagnosis Date   Gestational diabetes    Past Surgical History: Past Surgical History:  Procedure Laterality Date   NO PAST SURGERIES     Social History: Social History   Socioeconomic History   Marital status: Single    Spouse name: Not on file   Number of children: Not on file   Years of education: Not on file   Highest education level: Not on file  Occupational History   Not on file  Tobacco Use   Smoking status: Former   Smokeless tobacco: Never  Vaping Use   Vaping Use: Never used  Substance and Sexual Activity   Alcohol use: No   Drug use: No   Sexual activity: Yes    Birth control/protection: None  Other Topics Concern   Not on file  Social History Narrative   Not on file   Social Determinants of Health   Financial Resource Strain: Not on file  Food Insecurity: Not on file  Transportation Needs: Not on file  Physical Activity: Not on file  Stress: Not on file  Social Connections: Not on file   Family History: Family History  Problem Relation Age of Onset   Migraines Mother    Stroke Father    Depression Father    Anxiety disorder Father    Diabetes Father    Alcohol abuse Neg Hx    Arthritis Neg Hx    Asthma Neg Hx    Birth defects Neg Hx    COPD Neg Hx    Cancer Neg Hx    Drug abuse Neg Hx    Early death Neg Hx    Hearing loss Neg Hx    Heart disease Neg Hx    Hyperlipidemia Neg Hx    Hypertension Neg Hx    Kidney disease Neg Hx    Learning disabilities Neg Hx    Mental illness Neg Hx    Mental retardation Neg Hx    Miscarriages / Stillbirths Neg Hx    Vision loss Neg Hx     Varicose Veins Neg Hx    Allergies: Allergies  Allergen Reactions   Bactrim [Sulfamethoxazole-Trimethoprim] Rash   Medications: See med rec.  Review of Systems: No headache, visual changes, nausea, vomiting, diarrhea, constipation, dizziness, abdominal pain, skin rash, fevers, chills, night sweats, swollen lymph nodes, weight loss, chest pain, body aches, joint swelling, muscle aches, shortness of breath, mood changes, visual or auditory hallucinations.  Objective:    BP 128/81   Pulse 97   Ht 5\' 2"  (1.575 m)   Wt 159 lb 3.2 oz (72.2 kg)   SpO2 100%   BMI 29.12 kg/m   General: Well Developed, well nourished, and in no acute distress.  Neuro: Alert and oriented x3, extra-ocular muscles intact, sensation grossly intact. Cranial nerves II through XII are intact, motor, sensory, and coordinative functions are all intact. HEENT: Normocephalic, atraumatic, pupils equal round reactive to light, neck supple, no masses, no lymphadenopathy, thyroid nonpalpable. Oropharynx, nasopharynx, external ear canals are unremarkable. Skin: Warm and dry, no rashes noted. Cardiac: Regular rate and rhythm, no murmurs rubs or gallops. Respiratory: Clear to  auscultation bilaterally. Not using accessory muscles, speaking in full sentences. Abdominal: Soft, nontender, nondistended, positive bowel sounds, no masses, no organomegaly. Musculoskeletal: Shoulder, elbow, wrist, hip, knee, ankle stable, and with full range of motion.  Impression and Recommendations:    Wellness examination Routine HCM labs ordered. HCM reviewed/discussed. Anticipatory guidance regarding healthy weight, lifestyle and choices given. Recommend healthy diet.  Recommend approximately 150 minutes/week of moderate intensity exercise Recommend regular dental and vision exams Always use seatbelt/lap and shoulder restraints Recommend using smoke alarms and checking batteries at least twice a year Recommend using sunscreen when  outside Discussed tetanus immunization recommendations, patient is UTD  ADHD Patient has been utilizing Adderall XR 20 mg daily, however feels that symptoms are better controlled with a shorter acting form of Adderall.  We will transition her back to shorter acting form of total daily dose of 20 mg.  New prescription sent to pharmacy for Adderall 10 mg to be taken twice daily.  PDMP reviewed, no red flags Plan for follow-up in about 1 month to assess response to medication change, follow-up can be virtual  Indicates that she feels that she has had some increase in vaginal bleeding in recent months.  She does have an IUD in place, has been in place for about 2 years, indicates that it is a copper IUD.  She does have establishing visit with OB/GYN later this year.  Occasionally she will have some symptoms where she feels more fatigued when bleeding is heavier.  We will be checking baseline labs related to CPE.  Advised that she could also contact the OB/GYN office to see if they can move her appointment up if possible.  Return in about 4 weeks (around 12/06/2021) for Med check - ADHD.   ___________________________________________ Adrian Dinovo de [place], MD, ABFM, [facility] Primary Care and Sports Medicine [facility]

## 2021-11-08 NOTE — Assessment & Plan Note (Signed)
Patient has been utilizing Adderall XR 20 mg daily, however feels that symptoms are better controlled with a shorter acting form of Adderall.  We will transition her back to shorter acting form of total daily dose of 20 mg.  New prescription sent to pharmacy for Adderall 10 mg to be taken twice daily.  PDMP reviewed, no red flags Plan for follow-up in about 1 month to assess response to medication change, follow-up can be virtual

## 2021-11-08 NOTE — Assessment & Plan Note (Signed)
Routine HCM labs ordered. HCM reviewed/discussed. Anticipatory guidance regarding healthy weight, lifestyle and choices given. Recommend healthy diet.  Recommend approximately 150 minutes/week of moderate intensity exercise Recommend regular dental and vision exams Always use seatbelt/lap and shoulder restraints Recommend using smoke alarms and checking batteries at least twice a year Recommend using sunscreen when outside Discussed tetanus immunization recommendations, patient is UTD 

## 2021-11-09 LAB — COMPREHENSIVE METABOLIC PANEL
ALT: 32 IU/L (ref 0–32)
AST: 21 IU/L (ref 0–40)
Albumin/Globulin Ratio: 1.7 (ref 1.2–2.2)
Albumin: 4.6 g/dL (ref 4.0–5.0)
Alkaline Phosphatase: 57 IU/L (ref 44–121)
BUN/Creatinine Ratio: 21 (ref 9–23)
BUN: 14 mg/dL (ref 6–20)
Bilirubin Total: 0.6 mg/dL (ref 0.0–1.2)
CO2: 18 mmol/L — ABNORMAL LOW (ref 20–29)
Calcium: 9.7 mg/dL (ref 8.7–10.2)
Chloride: 101 mmol/L (ref 96–106)
Creatinine, Ser: 0.66 mg/dL (ref 0.57–1.00)
Globulin, Total: 2.7 g/dL (ref 1.5–4.5)
Glucose: 82 mg/dL (ref 70–99)
Potassium: 4.7 mmol/L (ref 3.5–5.2)
Sodium: 136 mmol/L (ref 134–144)
Total Protein: 7.3 g/dL (ref 6.0–8.5)
eGFR: 123 mL/min/{1.73_m2} (ref >59)

## 2021-11-09 LAB — CBC WITH DIFFERENTIAL/PLATELET
Basophils Absolute: 0.1 10*3/uL (ref 0.0–0.2)
Basos: 1 %
EOS (ABSOLUTE): 0 10*3/uL (ref 0.0–0.4)
Eos: 0 %
Hematocrit: 40.4 % (ref 34.0–46.6)
Hemoglobin: 13.7 g/dL (ref 11.1–15.9)
Immature Grans (Abs): 0 10*3/uL (ref 0.0–0.1)
Immature Granulocytes: 0 %
Lymphocytes Absolute: 2.4 10*3/uL (ref 0.7–3.1)
Lymphs: 33 %
MCH: 30.8 pg (ref 26.6–33.0)
MCHC: 33.9 g/dL (ref 31.5–35.7)
MCV: 91 fL (ref 79–97)
Monocytes Absolute: 0.5 10*3/uL (ref 0.1–0.9)
Monocytes: 7 %
Neutrophils Absolute: 4.3 10*3/uL (ref 1.4–7.0)
Neutrophils: 59 %
Platelets: 376 10*3/uL (ref 150–450)
RBC: 4.45 x10E6/uL (ref 3.77–5.28)
RDW: 12.4 % (ref 11.7–15.4)
WBC: 7.3 10*3/uL (ref 3.4–10.8)

## 2021-11-09 LAB — TSH RFX ON ABNORMAL TO FREE T4: TSH: 0.906 u[IU]/mL (ref 0.450–4.500)

## 2021-11-09 LAB — LIPID PANEL
Chol/HDL Ratio: 3.2 ratio (ref 0.0–4.4)
Cholesterol, Total: 210 mg/dL — ABNORMAL HIGH (ref 100–199)
HDL: 65 mg/dL (ref >39)
LDL Chol Calc (NIH): 133 mg/dL — ABNORMAL HIGH (ref 0–99)
Triglycerides: 67 mg/dL (ref 0–149)
VLDL Cholesterol Cal: 12 mg/dL (ref 5–40)

## 2021-11-15 ENCOUNTER — Ambulatory Visit (HOSPITAL_BASED_OUTPATIENT_CLINIC_OR_DEPARTMENT_OTHER): Payer: Medicaid Other | Admitting: Family Medicine

## 2021-12-07 ENCOUNTER — Ambulatory Visit (INDEPENDENT_AMBULATORY_CARE_PROVIDER_SITE_OTHER): Payer: Medicaid Other | Admitting: Family Medicine

## 2021-12-07 ENCOUNTER — Encounter (HOSPITAL_BASED_OUTPATIENT_CLINIC_OR_DEPARTMENT_OTHER): Payer: Self-pay | Admitting: Family Medicine

## 2021-12-07 DIAGNOSIS — F909 Attention-deficit hyperactivity disorder, unspecified type: Secondary | ICD-10-CM

## 2021-12-07 DIAGNOSIS — K920 Hematemesis: Secondary | ICD-10-CM | POA: Diagnosis not present

## 2021-12-07 MED ORDER — AMPHETAMINE-DEXTROAMPHET ER 20 MG PO CP24: 20.0000 mg | ORAL_CAPSULE | ORAL | 0 refills | Status: DC

## 2021-12-07 NOTE — Assessment & Plan Note (Signed)
Reports having 1 episode since our last visit where she vomited and noted some blood in her vomit, indicates that the volume of blood appeared to be very small.  She occasionally has very mild abdominal pain which is also infrequent.  She reports that typically she will fast during the day and then eats in the evening and thinks that she may overeat as a result and end up having intermittent vomiting due to eating too much.  Generally she has not been having any significant abdominal pain.  She does report intermittent constipation and diarrhea from time to time.  Denies any prior GI evaluation.  No current issues with lightheadedness or dizziness.  No chest pain. Given reported symptoms, will proceed with referral to gastroenterology for further evaluation and recommendations Did discuss precautions with patient, in particular if she does have any further vomiting with blood noted, worsening abdominal pain, fever, chills, would present to emergency department for further evaluation and to rule out any underlying serious source of symptoms

## 2021-12-07 NOTE — Progress Notes (Signed)
Virtual Visit via Telephone   I connected with  Hayley Lawson  on 12/07/21 by telephone/telehealth and verified that I am speaking with the correct person using two identifiers.   I discussed the limitations, risks, security and privacy concerns of performing an evaluation and management service by telephone, including the higher likelihood of inaccurate diagnosis and treatment, and the availability of in person appointments.  We also discussed the likely need of an additional face to face encounter for complete and high quality delivery of care.  I also discussed with the patient that there may be a patient responsible charge related to this service. The patient expressed understanding and wishes to proceed.  Provider location is in medical facility. Patient location is at their home, different from provider location. People involved in care of the patient during this telehealth encounter were myself, my nurse/medical assistant, and my front office/scheduling team member.  Review of Systems: No fevers, chills, night sweats, weight loss, chest pain, or shortness of breath.   Objective Findings:    General: Speaking full sentences, no audible heavy breathing.  Sounds alert and appropriately interactive.    Independent interpretation of tests performed by another provider:   None.  Brief History, Exam, Impression, and Recommendations:    ADHD At last visit, patient felt that longer acting form of Adderall was not adequately controlling symptoms through the day and she preferred to try twice daily regimen.  Of course, she feels that this is not appropriate for her as she tends to forget the second dose of the medication and she would prefer to return back to longer acting form of Adderall.  Generally she has tolerated Adderall well in both formulations, she is just not able to regularly remember the second dose of the medication.  Denies any issues with appetite, chest pain, palpitations,  sleep issues We will plan to proceed with returning to longer acting form of Adderall at 20 mg dose.  Continue to monitor for control of symptoms Plan for follow-up in about 2 to 3 months to assess progress or sooner as needed  Hematemesis Reports having 1 episode since our last visit where she vomited and noted some blood in her vomit, indicates that the volume of blood appeared to be very small.  She occasionally has very mild abdominal pain which is also infrequent.  She reports that typically she will fast during the day and then eats in the evening and thinks that she may overeat as a result and end up having intermittent vomiting due to eating too much.  Generally she has not been having any significant abdominal pain.  She does report intermittent constipation and diarrhea from time to time.  Denies any prior GI evaluation.  No current issues with lightheadedness or dizziness.  No chest pain. Given reported symptoms, will proceed with referral to gastroenterology for further evaluation and recommendations Did discuss precautions with patient, in particular if she does have any further vomiting with blood noted, worsening abdominal pain, fever, chills, would present to emergency department for further evaluation and to rule out any underlying serious source of symptoms  I discussed the above assessment and treatment plan with the patient. The patient was provided an opportunity to ask questions and all were answered. The patient agreed with the plan and demonstrated an understanding of the instructions.  The patient was advised to call back or seek an in-person evaluation if the symptoms worsen or if the condition fails to improve as anticipated.  I provided  15 minutes of face to face and non-face-to-face time during this encounter date, time was needed to gather information, review chart, records, communicate/coordinate with staff remotely, as well as complete  documentation.   ___________________________________________ Deloyd Handy de [place], MD, ABFM, CAQSM Primary Care and Sports Medicine [facility]

## 2021-12-07 NOTE — Assessment & Plan Note (Signed)
At last visit, patient felt that longer acting form of Adderall was not adequately controlling symptoms through the day and she preferred to try twice daily regimen.  Of course, she feels that this is not appropriate for her as she tends to forget the second dose of the medication and she would prefer to return back to longer acting form of Adderall.  Generally she has tolerated Adderall well in both formulations, she is just not able to regularly remember the second dose of the medication.  Denies any issues with appetite, chest pain, palpitations, sleep issues We will plan to proceed with returning to longer acting form of Adderall at 20 mg dose.  Continue to monitor for control of symptoms Plan for follow-up in about 2 to 3 months to assess progress or sooner as needed

## 2021-12-26 ENCOUNTER — Ambulatory Visit: Payer: Medicaid Other | Admitting: Neurology

## 2021-12-26 ENCOUNTER — Encounter: Payer: Self-pay | Admitting: Neurology

## 2022-01-09 ENCOUNTER — Other Ambulatory Visit (HOSPITAL_BASED_OUTPATIENT_CLINIC_OR_DEPARTMENT_OTHER): Payer: Self-pay | Admitting: Family Medicine

## 2022-01-09 DIAGNOSIS — F909 Attention-deficit hyperactivity disorder, unspecified type: Secondary | ICD-10-CM

## 2022-01-09 MED ORDER — AMPHETAMINE-DEXTROAMPHET ER 20 MG PO CP24: 20.0000 mg | ORAL_CAPSULE | ORAL | 0 refills | Status: DC

## 2022-02-16 ENCOUNTER — Telehealth (HOSPITAL_BASED_OUTPATIENT_CLINIC_OR_DEPARTMENT_OTHER): Payer: Self-pay

## 2022-02-16 DIAGNOSIS — F909 Attention-deficit hyperactivity disorder, unspecified type: Secondary | ICD-10-CM

## 2022-02-16 MED ORDER — AMPHETAMINE-DEXTROAMPHET ER 20 MG PO CP24: 20.0000 mg | ORAL_CAPSULE | ORAL | 0 refills | Status: DC

## 2022-02-16 NOTE — Addendum Note (Signed)
Addended by: DE [place], [place]ha Konicki J on: 02/16/2022 12:29 PM   Modules accepted: Orders

## 2022-02-16 NOTE — Telephone Encounter (Signed)
Pt called and requested a refill on her ADDERALL.

## 2022-02-28 LAB — OB RESULTS CONSOLE ABO/RH: RH Type: POSITIVE

## 2022-02-28 LAB — OB RESULTS CONSOLE HIV ANTIBODY (ROUTINE TESTING): HIV: NONREACTIVE

## 2022-02-28 LAB — OB RESULTS CONSOLE ANTIBODY SCREEN: Antibody Screen: NEGATIVE

## 2022-02-28 LAB — OB RESULTS CONSOLE PLATELET COUNT: Platelets: 385

## 2022-02-28 LAB — HEPATITIS C ANTIBODY: HCV Ab: NEGATIVE

## 2022-02-28 LAB — OB RESULTS CONSOLE HGB/HCT, BLOOD
HCT: 38 (ref 29–41)
Hemoglobin: 12.7

## 2022-02-28 LAB — OB RESULTS CONSOLE RPR: RPR: NONREACTIVE

## 2022-02-28 LAB — OB RESULTS CONSOLE HEPATITIS B SURFACE ANTIGEN: Hepatitis B Surface Ag: NEGATIVE

## 2022-02-28 LAB — OB RESULTS CONSOLE RUBELLA ANTIBODY, IGM: Rubella: IMMUNE

## 2022-03-12 ENCOUNTER — Ambulatory Visit (HOSPITAL_BASED_OUTPATIENT_CLINIC_OR_DEPARTMENT_OTHER): Payer: Medicaid Other | Admitting: Obstetrics & Gynecology

## 2022-03-12 ENCOUNTER — Encounter (HOSPITAL_BASED_OUTPATIENT_CLINIC_OR_DEPARTMENT_OTHER): Payer: Self-pay | Admitting: Obstetrics & Gynecology

## 2022-03-12 ENCOUNTER — Ambulatory Visit (INDEPENDENT_AMBULATORY_CARE_PROVIDER_SITE_OTHER): Payer: Medicaid Other | Admitting: Obstetrics & Gynecology

## 2022-03-12 ENCOUNTER — Ambulatory Visit (INDEPENDENT_AMBULATORY_CARE_PROVIDER_SITE_OTHER): Payer: Medicaid Other | Admitting: *Deleted

## 2022-03-12 ENCOUNTER — Ambulatory Visit (INDEPENDENT_AMBULATORY_CARE_PROVIDER_SITE_OTHER): Payer: Medicaid Other

## 2022-03-12 ENCOUNTER — Encounter (HOSPITAL_BASED_OUTPATIENT_CLINIC_OR_DEPARTMENT_OTHER): Payer: Self-pay | Admitting: *Deleted

## 2022-03-12 ENCOUNTER — Other Ambulatory Visit (HOSPITAL_COMMUNITY)
Admission: RE | Admit: 2022-03-12 | Discharge: 2022-03-12 | Disposition: A | Discharge status: Home / Self Care | Payer: Medicaid Other | Source: Ambulatory Visit | Attending: Obstetrics & Gynecology | Admitting: Obstetrics & Gynecology

## 2022-03-12 VITALS — Ht 62.0 in | Wt 167.0 lb

## 2022-03-12 DIAGNOSIS — O3680X0 Pregnancy with inconclusive fetal viability, not applicable or unspecified: Secondary | ICD-10-CM

## 2022-03-12 DIAGNOSIS — F909 Attention-deficit hyperactivity disorder, unspecified type: Secondary | ICD-10-CM

## 2022-03-12 DIAGNOSIS — Z3A09 9 weeks gestation of pregnancy: Secondary | ICD-10-CM | POA: Diagnosis not present

## 2022-03-12 DIAGNOSIS — Z3481 Encounter for supervision of other normal pregnancy, first trimester: Secondary | ICD-10-CM

## 2022-03-12 DIAGNOSIS — O3680X1 Pregnancy with inconclusive fetal viability, fetus 1: Secondary | ICD-10-CM | POA: Diagnosis not present

## 2022-03-12 DIAGNOSIS — Z3201 Encounter for pregnancy test, result positive: Secondary | ICD-10-CM | POA: Diagnosis not present

## 2022-03-12 DIAGNOSIS — B009 Herpesviral infection, unspecified: Secondary | ICD-10-CM | POA: Diagnosis not present

## 2022-03-12 DIAGNOSIS — Z3401 Encounter for supervision of normal first pregnancy, first trimester: Secondary | ICD-10-CM

## 2022-03-12 DIAGNOSIS — Z3490 Encounter for supervision of normal pregnancy, unspecified, unspecified trimester: Secondary | ICD-10-CM | POA: Insufficient documentation

## 2022-03-12 DIAGNOSIS — O0993 Supervision of high risk pregnancy, unspecified, third trimester: Secondary | ICD-10-CM

## 2022-03-12 DIAGNOSIS — N926 Irregular menstruation, unspecified: Secondary | ICD-10-CM

## 2022-03-12 DIAGNOSIS — G43909 Migraine, unspecified, not intractable, without status migrainosus: Secondary | ICD-10-CM

## 2022-03-12 HISTORY — DX: Supervision of high risk pregnancy, unspecified, third trimester: O09.93

## 2022-03-12 LAB — POCT URINE PREGNANCY: Preg Test, Ur: POSITIVE — AB

## 2022-03-12 MED ORDER — BLOOD PRESSURE KIT DEVI: 1.0000 | Freq: Once | 0 refills | Status: AC

## 2022-03-12 NOTE — Progress Notes (Deleted)
New OB Intake  I explained I am completing New OB Intake today. We discussed EDD of 10/15/22 that is based on LMP of 01/08/22. Pt is G4/P3. I reviewed her allergies, medications, Medical/Surgical/OB history, and appropriate screenings. I informed her of [facility] services. [facility] information placed in AVS. Based on history, this is a low risk pregnancy.  Patient Active Problem List   Diagnosis Date Noted   Hematemesis 12/07/2021   Wellness examination 11/08/2021   Migraines 09/05/2021   ADHD 09/05/2021    Concerns addressed today  Delivery Plans Plans to deliver at [facility]. Patient given information for [facility] Healthy Baby website for more information about Women's and Children's Center. Patient is interested in water birth. Offered upcoming OB visit with CNM to discuss further.  MyChart/Babyscripts MyChart access verified. I explained pt will have some visits in office and some virtually. Babyscripts instructions given and order placed.   Blood Pressure Cuff/Weight Scale Blood pressure cuff ordered for patient to pick-up from [employer]. Explained after first prenatal appt pt will check weekly and document in Babyscripts.  Anatomy US Explained first scheduled [place] will be around 19 weeks. Anatomy US scheduled for 05/21/22 at 0945. Pt notified to arrive at 0930.  Labs Discussed Avelina Laine genetic screening with patient. Would like both Panorama and Horizon drawn at new OB visit. Routine prenatal labs needed.   Social Determinants of Health Food Insecurity: Patient denies food insecurity. Transportation: Patient denies transportation needs.     Harrie Jeans, RN 03/12/2022  12:50 PM

## 2022-03-12 NOTE — Progress Notes (Signed)
History:   Hayley Lawson is a 26 y.o. G4P3003 at [redacted]w[redacted]d by LMP being seen today for her first obstetrical visit.  Her obstetrical history is significant for  h/o HSV . Patient does intend to breast feed. Pregnancy history fully reviewed.  Patient reports no complaints.      HISTORY: OB History  Gravida Para Term Preterm AB Living  4 3 3  0 0 3  SAB IAB Ectopic Multiple Live Births  0 0 0 0 3    # Outcome Date GA Lbr Len/2nd Weight Sex Delivery Anes PTL Lv  4 Current           3 Term      Vag-Spont   LIV  2 Term      Vag-Spont   LIV  1 Term      Vag-Spont   LIV    Last pap smear was done 02/06/2022 and was normal  Past Medical History:  Diagnosis Date   ADHD    Gestational diabetes    Past Surgical History:  Procedure Laterality Date   CHOLECYSTECTOMY  2019   NO PAST SURGERIES     Family History  Problem Relation Age of Onset   Migraines Mother    Stroke Father    Depression Father    Anxiety disorder Father    Diabetes Father    Heart disease Maternal Grandmother    Alcohol abuse Neg Hx    Arthritis Neg Hx    Asthma Neg Hx    Birth defects Neg Hx    COPD Neg Hx    Cancer Neg Hx    Drug abuse Neg Hx    Early death Neg Hx    Hearing loss Neg Hx    Hyperlipidemia Neg Hx    Hypertension Neg Hx    Kidney disease Neg Hx    Learning disabilities Neg Hx    Mental illness Neg Hx    Mental retardation Neg Hx    Miscarriages / Stillbirths Neg Hx    Vision loss Neg Hx    Varicose Veins Neg Hx    Social History   Tobacco Use   Smoking status: Former   Smokeless tobacco: Never  2020 Use: Never used  Substance Use Topics   Alcohol use: No   Drug use: No   Allergies  Allergen Reactions   Bactrim [Sulfamethoxazole-Trimethoprim] Rash   Doxycycline Rash   Current Outpatient Medications on File Prior to Visit  Medication Sig Dispense Refill   amphetamine-dextroamphetamine (ADDERALL XR) 20 MG 24 hr capsule Take 1 capsule (20 mg total) by mouth  every morning. 30 capsule 0   No current facility-administered medications on file prior to visit.    Review of Systems Pertinent items noted in HPI and remainder of comprehensive ROS otherwise negative.  Physical Exam:  There were no vitals filed for this visit.  Vitals were not taken as pt was scheduled for gyn visit and then reported she was pregnant.     Bedside Ultrasound for FHR check: Viable intrauterine pregnancy with positive cardiac activity noted, fetal heart rate 172bpm Patient informed that the ultrasound is considered a limited obstetric ultrasound and is not intended to be a complete ultrasound exam.  Patient also informed that the ultrasound is not being completed with the intent of assessing for fetal or placental anomalies or any pelvic abnormalities.  Explained that the purpose of today's ultrasound is to assess for fetal heart rate.  Patient acknowledges the  purpose of the exam and the limitations of the study. General: well-developed, well-nourished female in no acute distress  Breasts:  deferred  Skin: normal coloration and turgor, no rashes  Neurologic: oriented, normal, negative, normal mood  Extremities: normal strength, tone, and muscle mass, ROM of all joints is normal  HEENT PERRLA, extraocular movement intact and sclera clear, anicteric  Neck supple and no masses  Cardiovascular: regular rate and rhythm  Respiratory:  no respiratory distress, normal breath sounds  Abdomen: soft, non-tender; bowel sounds normal; no masses,  no organomegaly  Pelvic: Deferred as has had new ob with another provider    Assessment:    Pregnancy: [id number] Patient Active Problem List   Diagnosis Date Noted   Supervision of normal pregnancy 03/12/2022   Migraines 09/05/2021   ADHD 09/05/2021   H/O preterm delivery, currently pregnant 09/02/2015   Herpes 01/14/2013     Plan:    1. Encounter for supervision of other normal pregnancy in first trimester - on PNV - Urine  Culture - Cervicovaginal ancillary only( [facility]) - [place] MFM OB COMP + 14 WK; Future - Ambulatory referral to Integrated Behavioral Health - Babyscripts Schedule Optimization  2. [redacted] weeks gestation of pregnancy  3. Attention deficit hyperactivity disorder (ADHD), unspecified ADHD type - advised to stop Adderall now  4. Herpes - will treat with valtrex at 36 weeks  5. Migraine without status migrainosus, not intractable, unspecified migraine type  Initial labs drawn. Continue prenatal vitamins. Problem list reviewed and updated. Genetic Screening discussed, NIPS:  planned.  Pt will return in 1 week for this . Ultrasound discussed; fetal anatomic survey: requested. Anticipatory guidance about prenatal visits given including labs, ultrasounds, and testing. Discussed usage of Babyscripts and virtual visits as additional source of managing and completing prenatal visits in midst of coronavirus and pandemic.   Encouraged to complete MyChart Registration for her ability to review results, send requests, and have questions addressed.  The nature of [facility] Healthcare/Faculty Practice with multiple MDs and Advanced Practice Providers was explained to patient; also emphasized that residents, students are part of our team. Routine obstetric precautions reviewed. Encouraged to seek out care at office or emergency room [facility] preferred) for urgent and/or emergent concerns. Return in about 1 month (around 04/11/2022).     Lum Keas, MD, FACOG Obstetrician & Gynecologist, [facility] for [facility], [facility]

## 2022-03-12 NOTE — Progress Notes (Signed)
New OB Intake  I explained I am completing New OB Intake today. We discussed EDD of 10/15/22 that is based on LMP of 01/08/22. Pt is G4/P3. I reviewed her allergies, medications, Medical/Surgical/OB history, and appropriate screenings. I informed her of [facility] services. [facility] information placed in AVS. Based on history, this is a low risk pregnancy.  Patient Active Problem List   Diagnosis Date Noted   Hematemesis 12/07/2021   Wellness examination 11/08/2021   Migraines 09/05/2021   ADHD 09/05/2021    Concerns addressed today  Delivery Plans Plans to deliver at [facility]. Patient given information for [facility] Healthy Baby website for more information about Women's and Children's Center. Patient is interested in water birth. Offered upcoming OB visit with CNM to discuss further.  MyChart/Babyscripts MyChart access verified. I explained pt will have some visits in office and some virtually. Babyscripts instructions given and order placed.   Blood Pressure Cuff/Weight Scale Blood pressure cuff ordered for patient to pick-up from [employer]. Explained after first prenatal appt pt will check weekly and document in Babyscripts.  Anatomy US Explained first scheduled [place] will be around 19 weeks. Anatomy US scheduled for 05/21/22 at 0945. Pt notified to arrive at 0930.  Labs Discussed Avelina Laine genetic screening with patient. Would like both Panorama and Horizon drawn at new OB visit. Routine prenatal labs needed.   Social Determinants of Health Food Insecurity: Patient denies food insecurity. Transportation: Patient denies transportation needs.    Harrie Jeans, RN 03/12/2022  12:55 PM

## 2022-03-13 LAB — CERVICOVAGINAL ANCILLARY ONLY
Chlamydia: NEGATIVE
Comment: NEGATIVE
Comment: NORMAL
Neisseria Gonorrhea: NEGATIVE

## 2022-03-14 LAB — URINE CULTURE

## 2022-03-19 ENCOUNTER — Other Ambulatory Visit (HOSPITAL_BASED_OUTPATIENT_CLINIC_OR_DEPARTMENT_OTHER): Payer: Medicaid Other

## 2022-03-19 DIAGNOSIS — Z3481 Encounter for supervision of other normal pregnancy, first trimester: Secondary | ICD-10-CM

## 2022-03-19 NOTE — Progress Notes (Signed)
Pt here for prenatal labs.

## 2022-03-20 LAB — HEMOGLOBIN A1C
Est. average glucose Bld gHb Est-mCnc: 114 mg/dL
Hgb A1c MFr Bld: 5.6 % (ref 4.8–5.6)

## 2022-03-20 LAB — CBC
Hematocrit: 38.9 % (ref 34.0–46.6)
Hemoglobin: 13 g/dL (ref 11.1–15.9)
MCH: 30.4 pg (ref 26.6–33.0)
MCHC: 33.4 g/dL (ref 31.5–35.7)
MCV: 91 fL (ref 79–97)
Platelets: 428 10*3/uL (ref 150–450)
RBC: 4.27 x10E6/uL (ref 3.77–5.28)
RDW: 12.7 % (ref 11.7–15.4)
WBC: 8.4 10*3/uL (ref 3.4–10.8)

## 2022-03-20 LAB — HEPATITIS C ANTIBODY: Hep C Virus Ab: NONREACTIVE

## 2022-03-20 LAB — RPR: RPR Ser Ql: NONREACTIVE

## 2022-03-20 LAB — ABO/RH: Rh Factor: POSITIVE

## 2022-03-20 LAB — ANTIBODY SCREEN: Antibody Screen: NEGATIVE

## 2022-03-20 LAB — HIV ANTIBODY (ROUTINE TESTING W REFLEX): HIV Screen 4th Generation wRfx: NONREACTIVE

## 2022-03-20 LAB — RUBELLA SCREEN: Rubella Antibodies, IGG: 1.57 index (ref >0.99)

## 2022-03-20 LAB — HEPATITIS B SURFACE ANTIGEN: Hepatitis B Surface Ag: NEGATIVE

## 2022-03-26 ENCOUNTER — Telehealth (HOSPITAL_BASED_OUTPATIENT_CLINIC_OR_DEPARTMENT_OTHER): Payer: Self-pay | Admitting: *Deleted

## 2022-03-26 ENCOUNTER — Encounter: Payer: Medicaid Other | Admitting: Nurse Practitioner

## 2022-03-26 LAB — PANORAMA PRENATAL TEST FULL PANEL:PANORAMA TEST PLUS 5 ADDITIONAL MICRODELETIONS: FETAL FRACTION: 4.6

## 2022-03-26 NOTE — Progress Notes (Signed)
Hayley Lawson,  Vaginal discharge may increase during pregnancy. Due to the risk of taking certain medications while pregnant It is preferred you have a vaginal exam in person to diagnose your vaginitis, and to offer the correct treatment plan based on that.   We would recommend you contact your OBGYN   If you are not established with an OB yet you may visit a location below   NOTE: There will be NO CHARGE for this eVisit   If you are having a true medical emergency please call 911.      For an urgent face to face visit, [facility] has seven urgent care centers for your convenience:     [facility] Center at [facility] Directions [phone] [address] [place], [place] [address]    [facility] Center [facility]) Get Driving Directions [phone] [address] [place], [place] [address]  [facility] [place]) Get Driving Directions [phone] [address] [place],  [place]  [address]  [facility] Health Urgent Care Center [facility] - at [employer] Directions  [phone] [place],  [place] [address]   [facility] Health Urgent Care at [facility] Get Driving Directions [phone] [address] [place], [place] [address]   [facility] Health Urgent Care at [facility] Get Driving Directions  [phone] [address] [place], [place] [address]   [facility] Health Urgent Care at [facility] Directions [phone] [address], [address] [place], [place] [address]  Your MyChart E-visit questionnaire answers were reviewed by a board certified advanced clinical practitioner to complete your personal care plan based on your specific symptoms.  Thank you for using e-Visits.

## 2022-03-26 NOTE — Telephone Encounter (Signed)
LMOVM for pt to call regarding BP entered into babyscripts

## 2022-03-27 NOTE — BH Specialist Note (Signed)
Integrated Behavioral Health via Telemedicine Visit  04/05/2022 Hayley Lawson [id number]  Number of Integrated Behavioral Health Clinician visits: 1- Initial Visit  Session Start time: 1018   Session End time: 1109  Total time in minutes: 51   Referring Provider: Valentina Shaggy, MD Patient/Family location: Home [facility] Provider location: Center for [facility] Healthcare at [facility]  All persons participating in visit: Patient Hayley Lawson and [facility] Ronica Vivian   Types of Service: Individual psychotherapy and Video visit  I connected with [name] and/or [name]  n/a  via  Telephone or Engineer, civil (consulting)  (Video is Caregility application) and verified that I am speaking with the correct person using two identifiers. Discussed confidentiality: Yes   I discussed the limitations of telemedicine and the availability of in person appointments.  Discussed there is a possibility of technology failure and discussed alternative modes of communication if that failure occurs.  I discussed that engaging in this telemedicine visit, they consent to the provision of behavioral healthcare and the services will be billed under their insurance.  Patient and/or legal guardian expressed understanding and consented to Telemedicine visit: Yes   Presenting Concerns: Patient and/or family reports the following symptoms/concerns: Not sleeping well with vivid dreams/nightmares, increase depression and anxiety symptoms, increased crying, leading to headaches; nausea continuing in pregnancy, "feel so weak" at times; financial stress; pt copes using meditation and journaling; open to implementing additional self-coping strategy. Pt previously on amitriptyline for depression and migraine; Adderall for ADHD; will resume Adderall postpartum with PCP.  Duration of problem: Increase current pregnancy; Severity of problem:  moderately severe  Patient and/or  Family's Strengths/Protective Factors: Sense of purpose  Goals Addressed: Patient will:  Reduce symptoms of: anxiety, depression, insomnia, mood instability, and stress   Increase knowledge and/or ability of: healthy habits, self-management skills, and stress reduction   Demonstrate ability to: Increase healthy adjustment to current life circumstances and Increase motivation to adhere to plan of care  Progress towards Goals: Ongoing  Interventions: Interventions utilized:  CBT Cognitive Behavioral Therapy, Sleep Hygiene, Psychoeducation and/or Health Education, and Link to [employer] Standardized Assessments completed: GAD-7 and PHQ 9  Patient and/or Family Response: Patient agrees with treatment plan.   Assessment: Patient currently experiencing Adjustment disorder with mixed anxiety and depressed mood, ADHD, Psychosocial stress.   Patient may benefit from psychoeducation and brief therapeutic interventions regarding coping with symptoms of depression, anxiety, insomnia, stress .  Plan: Follow up with behavioral health clinician on : Two weeks Behavioral recommendations:  -Continue taking prenatal vitamin as prescribed -Discuss antidepressant medication options at upcoming medical appointment on 04/12/2022 -Continue using self-coping strategies daily that are helping manage changing emotions (journal, meditation) -Begin Worry Time strategy, as discussed. Start by setting up start and end time reminders on phone today; continue daily for two weeks. -Read through After Visit Summary information; use as needed -Accept referrals to [facility] and [employer] Referral(s): Integrated Art gallery manager (In Clinic) and [employer] Resources:  Academic librarian  I discussed the assessment and treatment plan with the patient and/or parent/guardian. They were provided an opportunity to ask questions and all were answered. They agreed with the plan and demonstrated an understanding  of the instructions.   They were advised to call back or seek an in-person evaluation if the symptoms worsen or if the condition fails to improve as anticipated.  Rae Lips, LCSW     04/05/2022   10:34 AM 12/07/2021    1:24  PM 11/08/2021    9:54 AM 09/05/2021    9:21 AM  Depression screen PHQ 2/9  Decreased Interest 2 0 0 1  Down, Depressed, Hopeless 3 0 1 1  PHQ - 2 Score 5 0 1 2  Altered sleeping 3 0 0 0  Tired, decreased energy 3 0 1 1  Change in appetite 2 0 1 1  Feeling bad or failure about yourself  1 0 0 0  Trouble concentrating 3 0 3 1  Moving slowly or fidgety/restless 0 0 0 0  Suicidal thoughts 0 0 0 0  PHQ-9 Score 17 0 6 5  Difficult doing work/chores  Not difficult at all Extremely dIfficult Somewhat difficult      04/05/2022   10:36 AM 11/08/2021    9:55 AM  GAD 7 : Generalized Anxiety Score  Nervous, Anxious, on Edge 1 1  Control/stop worrying 3 1  Worry too much - different things 3 1  Trouble relaxing 3 0  Restless 3 0  Easily annoyed or irritable 2 1  Afraid - awful might happen 3 0  Total GAD 7 Score 18 4  Anxiety Difficulty  Somewhat difficult

## 2022-03-28 ENCOUNTER — Telehealth (HOSPITAL_BASED_OUTPATIENT_CLINIC_OR_DEPARTMENT_OTHER): Payer: Self-pay

## 2022-03-28 NOTE — Telephone Encounter (Signed)
LMOM at 10:44 for patient to call office. We are trying to reach out to her in reference to her baby script blood pressure reading. tbw

## 2022-03-29 NOTE — Telephone Encounter (Signed)
Patient called back and LMOM letting us know that she put in the wrong blood pressure. Instead of her diastolic being 155 it was actually 105. tbw

## 2022-04-02 LAB — HORIZON CUSTOM: REPORT SUMMARY: NEGATIVE

## 2022-04-03 ENCOUNTER — Encounter: Payer: Self-pay | Admitting: Nurse Practitioner

## 2022-04-03 ENCOUNTER — Encounter: Payer: Self-pay | Admitting: Internal Medicine

## 2022-04-05 ENCOUNTER — Other Ambulatory Visit (HOSPITAL_BASED_OUTPATIENT_CLINIC_OR_DEPARTMENT_OTHER): Payer: Self-pay | Admitting: Family Medicine

## 2022-04-05 ENCOUNTER — Ambulatory Visit (INDEPENDENT_AMBULATORY_CARE_PROVIDER_SITE_OTHER): Payer: Medicaid Other | Admitting: Clinical

## 2022-04-05 DIAGNOSIS — F4323 Adjustment disorder with mixed anxiety and depressed mood: Secondary | ICD-10-CM

## 2022-04-05 DIAGNOSIS — Z658 Other specified problems related to psychosocial circumstances: Secondary | ICD-10-CM

## 2022-04-05 DIAGNOSIS — F909 Attention-deficit hyperactivity disorder, unspecified type: Secondary | ICD-10-CM

## 2022-04-05 MED ORDER — AMPHETAMINE-DEXTROAMPHET ER 20 MG PO CP24: 20.0000 mg | ORAL_CAPSULE | ORAL | 0 refills | Status: DC

## 2022-04-05 NOTE — Telephone Encounter (Signed)
Called pt and LVM for letting her know refill was sent to pharmacy, and to sch a follow-up regarding medication management in 1 month with provider to continue to get refills on medication.

## 2022-04-05 NOTE — Patient Instructions (Signed)
Center for [facility] Healthcare at [facility] for Women [address] [place], [place] [address] [phone] (main office) [phone] [facility] office) [email or web address]  LIEAP (Low Income Energy Assistance Program) LowBlog.nl  [employer] (Low Income Public house manager Program) LittleDVDs.dk  [facility]  [address], [place], [place] [address] [phone] or [phone] WALK-IN URGENT CARE 24/7 FOR ANYONE [address], [place], [place]  [phone] Fax: [phone] [email or web address] *Interpreters available *Accepts all insurance and uninsured for Urgent Care needs *Accepts Medicaid and uninsured for outpatient treatment (below)    ONLY FOR [facility]  Below:   Outpatient New Patient Assessment/Therapy Walk-ins:        Monday -Thursday 8am until slots are full.        Every Friday 1pm-4pm  (first come, first served)                   New Patient Psychiatry/Medication Management        Monday-Friday 8am-11am (first come, first served)              For all walk-ins we ask that you arrive by 7:15am, because patients will be seen in the order of arrival.    [employer] and Websites Here are a few free apps meant to help you to help yourself.  To find, try searching on the internet to see if the app is offered on Apple/Android devices. If your first choice doesn't come up on your device, the good news is that there are many choices! Play around with different apps to see which ones are helpful to you.    Calm This is an app meant to help increase calm feelings. Includes info, strategies, and tools for tracking your feelings.      Calm Harm  This app is meant to help with self-harm. Provides many 5-minute or  15-min coping strategies for doing instead of hurting yourself.       Healthy Minds Health Minds is a problem-solving tool to help deal with emotions and cope with stress you encounter wherever you are.      MindShift This app can help people cope with anxiety. Rather than trying to avoid anxiety, you can make an important shift and face it.      MY3  MY3 features a support system, safety plan and resources with the goal of offering a tool to use in a time of need.       My Life My Voice  This mood journal offers a simple solution for tracking your thoughts, feelings and moods. Animated emoticons can help identify your mood.       Relax Melodies Designed to help with sleep, on this app you can mix sounds and meditations for relaxation.      Smiling Mind Smiling Mind is meditation made easy: it's a simple tool that helps put a smile on your mind.        Stop, Breathe & Think  A friendly, simple guide for people through meditations for mindfulness and compassion.  Stop, Breathe and Think Kids Enter your current feelings and choose a "mission" to help you cope. Offers videos for certain moods instead of just sound recordings.       Team Orange The goal of this tool is to help teens change how they think, act, and react. This app helps you focus on your own good feelings and experiences.      The [employer] Box The [employer] Box (VHB) contains  simple tools to help patients with coping, relaxation, distraction, and positive thinking.

## 2022-04-09 NOTE — BH Specialist Note (Signed)
Integrated Behavioral Health via Telemedicine Visit  04/19/2022 Hayley Lawson [id number]  Number of Integrated Behavioral Health Clinician visits: 2- Second Visit  Session Start time: 1049   Session End time: 1131  Total time in minutes: 42   Referring Provider: Valentina Shaggy, MD Patient/Family location: Home [facility] Provider location: Center for [facility] Healthcare at [facility]  All persons participating in visit: Patient Hayley Lawson and Hayley Lawson   Types of Service: Individual psychotherapy and Video visit  I connected with Hayley Lawson and/or Hayley Lawson  n/a  via  Telephone or Engineer, civil (consulting)  (Video is Caregility application) and verified that I am speaking with the correct person using two identifiers. Discussed confidentiality: Yes   I discussed the limitations of telemedicine and the availability of in person appointments.  Discussed there is a possibility of technology failure and discussed alternative modes of communication if that failure occurs.  I discussed that engaging in this telemedicine visit, they consent to the provision of behavioral healthcare and the services will be billed under their insurance.  Patient and/or legal guardian expressed understanding and consented to Telemedicine visit: Yes   Presenting Concerns: Patient and/or family reports the following symptoms/concerns: Difficulty saying "no" to other people's requests for financial help; financial stress and family conflict.  Duration of problem: Increase in pregnancy; Severity of problem:  moderately severe  Patient and/or Family's Strengths/Protective Factors: Concrete supports in place (healthy food, safe environments, etc.) and Sense of purpose  Goals Addressed: Patient will:  Reduce symptoms of: anxiety, depression, insomnia, and stress    Demonstrate ability to: Increase motivation to adhere to plan of care and Setting healthy  boundaries  Progress towards Goals: Ongoing  Interventions: Interventions utilized:  Supportive Reflection Standardized Assessments completed: Not Needed  Patient and/or Family Response: Patient agrees with treatment plan.   Assessment: Patient currently experiencing Adjustment disorder with mixed anxiety and depressed mood, ADHD and Psychosocial stress.   Patient may benefit from continued therapeutic interventions.  Plan: Follow up with behavioral health clinician on : Two weeks Behavioral recommendations:  -Continue taking prenatal vitamin as prescribed -Continue using self-coping strategies daily (journal, meditation, prioritizing things within realm of control) -Begin practicing saying "no" to all requests of financial help from others for the next two weeks; give yourself permission to do this with no guilt. Referral(s): Integrated [employer] (In Clinic)  I discussed the assessment and treatment plan with the patient and/or parent/guardian. They were provided an opportunity to ask questions and all were answered. They agreed with the plan and demonstrated an understanding of the instructions.   They were advised to call back or seek an in-person evaluation if the symptoms worsen or if the condition fails to improve as anticipated.  Hayley Lips, LCSW     04/05/2022   10:34 AM 12/07/2021    1:24 PM 11/08/2021    9:54 AM 09/05/2021    9:21 AM  Depression screen PHQ 2/9  Decreased Interest 2 0 0 1  Down, Depressed, Hopeless 3 0 1 1  PHQ - 2 Score 5 0 1 2  Altered sleeping 3 0 0 0  Tired, decreased energy 3 0 1 1  Change in appetite 2 0 1 1  Feeling bad or failure about yourself  1 0 0 0  Trouble concentrating 3 0 3 1  Moving slowly or fidgety/restless 0 0 0 0  Suicidal thoughts 0 0 0 0  PHQ-9 Score 17 0 6 5  Difficult doing work/chores  Not difficult at all Extremely dIfficult Somewhat difficult      04/05/2022   10:36 AM 11/08/2021    9:55 AM   GAD 7 : Generalized Anxiety Score  Nervous, Anxious, on Edge 1 1  Control/stop worrying 3 1  Worry too much - different things 3 1  Trouble relaxing 3 0  Restless 3 0  Easily annoyed or irritable 2 1  Afraid - awful might happen 3 0  Total GAD 7 Score 18 4  Anxiety Difficulty  Somewhat difficult

## 2022-04-12 ENCOUNTER — Encounter (HOSPITAL_BASED_OUTPATIENT_CLINIC_OR_DEPARTMENT_OTHER): Payer: Self-pay | Admitting: Medical

## 2022-04-12 ENCOUNTER — Ambulatory Visit (INDEPENDENT_AMBULATORY_CARE_PROVIDER_SITE_OTHER): Payer: Medicaid Other | Admitting: Medical

## 2022-04-12 ENCOUNTER — Other Ambulatory Visit (HOSPITAL_COMMUNITY)
Admission: RE | Admit: 2022-04-12 | Discharge: 2022-04-12 | Disposition: A | Discharge status: Home / Self Care | Payer: Medicaid Other | Source: Ambulatory Visit | Attending: Medical | Admitting: Medical

## 2022-04-12 VITALS — BP 109/81 | HR 78 | Wt 169.4 lb

## 2022-04-12 DIAGNOSIS — Z3481 Encounter for supervision of other normal pregnancy, first trimester: Secondary | ICD-10-CM

## 2022-04-12 DIAGNOSIS — B3731 Acute candidiasis of vulva and vagina: Secondary | ICD-10-CM

## 2022-04-12 DIAGNOSIS — N898 Other specified noninflammatory disorders of vagina: Secondary | ICD-10-CM

## 2022-04-12 DIAGNOSIS — O219 Vomiting of pregnancy, unspecified: Secondary | ICD-10-CM

## 2022-04-12 DIAGNOSIS — F902 Attention-deficit hyperactivity disorder, combined type: Secondary | ICD-10-CM

## 2022-04-12 DIAGNOSIS — Z8632 Personal history of gestational diabetes: Secondary | ICD-10-CM

## 2022-04-12 DIAGNOSIS — O26891 Other specified pregnancy related conditions, first trimester: Secondary | ICD-10-CM

## 2022-04-12 DIAGNOSIS — O09291 Supervision of pregnancy with other poor reproductive or obstetric history, first trimester: Secondary | ICD-10-CM | POA: Insufficient documentation

## 2022-04-12 DIAGNOSIS — Z3A13 13 weeks gestation of pregnancy: Secondary | ICD-10-CM

## 2022-04-12 MED ORDER — PROMETHAZINE HCL 25 MG PO TABS: 25.0000 mg | ORAL_TABLET | Freq: Four times a day (QID) | ORAL | 1 refills | Status: DC | PRN

## 2022-04-12 MED ORDER — ASPIRIN 81 MG PO TBEC: 81.0000 mg | DELAYED_RELEASE_TABLET | Freq: Every day | ORAL | 12 refills | Status: DC

## 2022-04-12 NOTE — Progress Notes (Signed)
   PRENATAL VISIT NOTE  Subjective:  Hayley Lawson is a 26 y.o. G4P3003 at [redacted]w[redacted]d being seen today for ongoing prenatal care.  She is currently monitored for the following issues for this low-risk pregnancy and has Migraines; ADHD; Supervision of normal pregnancy; and History of gestational diabetes in prior pregnancy, currently pregnant in first trimester on their problem list.  Patient reports  vaginal discharge, N/V and constipation .  Contractions: Not present. Vag. Bleeding: Other (Spotting).  Movement: Absent. Denies leaking of fluid.   The following portions of the patient's history were reviewed and updated as appropriate: allergies, current medications, past family history, past medical history, past social history, past surgical history and problem list.   Objective:   Vitals:   04/12/22 1124  BP: 109/81  Pulse: 78  Weight: 169 lb 6.4 oz (76.8 kg)    Fetal Status: Fetal Heart Rate (bpm): 150   Movement: Absent     General:  Alert, oriented and cooperative. Patient is in no acute distress.  Skin: Skin is warm and dry. No rash noted.   Cardiovascular: Normal heart rate noted  Respiratory: Normal respiratory effort, no problems with respiration noted  Abdomen: Soft, gravid, appropriate for gestational age.  Pain/Pressure: Absent     Pelvic: Cervical exam deferred        Extremities: Normal range of motion.  Edema: Trace  Mental Status: Normal mood and affect. Normal behavior. Normal judgment and thought content.   Assessment and Plan:  Pregnancy: G4P3003 at [redacted]w[redacted]d 1. Encounter for supervision of other normal pregnancy in first trimester - Anatomy US scheduled 05/21/22 - Declined flu vaccine today  - Pt interested in waterbirth and has attended the class.   2. Nausea and vomiting during pregnancy - promethazine (PHENERGAN) 25 MG tablet; Take 1 tablet (25 mg total) by mouth every 6 (six) hours as needed for nausea or vomiting.  Dispense: 30 tablet; Refill: 1 - Continue  ginger supplement PRN - Discussed diet for N/V in pregnancy   3. History of gestational diabetes in prior pregnancy, currently pregnant in first trimester - aspirin EC 81 MG tablet; Take 1 tablet (81 mg total) by mouth daily. Swallow whole.  Dispense: 30 tablet; Refill: 12  4. Vaginal discharge during pregnancy in first trimester - Cervicovaginal ancillary only( [facility]) - Results will be sent through MyChart   5. Attention deficit hyperactivity disorder (ADHD), combined type - Was on Adderall, would like to know what options are safe in pregnancy, will discuss with Dr. Hyacinth Meeker   6. [redacted] weeks gestation of pregnancy  Preterm labor symptoms and general obstetric precautions including but not limited to vaginal bleeding, contractions, leaking of fluid and fetal movement were reviewed in detail with the patient. Please refer to After Visit Summary for other counseling recommendations.   Return in about 4 weeks (around 05/10/2022) for LOB, as scheduled.  Future Appointments  Date Time Provider Department Center  04/19/2022 10:45 AM [facility] HEALTH CLINICIAN [facility]  05/08/2022 10:35 AM [place]-[facility]ir[facility], Wilmer Floor, CNM [facility]  05/08/2022 11:30 AM Meredith Pel, NP [facility]  05/21/2022  9:45 AM [facility]    Vonzella Nipple, PA-C

## 2022-04-13 LAB — CERVICOVAGINAL ANCILLARY ONLY
Bacterial Vaginitis (gardnerella): NEGATIVE
Candida Glabrata: NEGATIVE
Candida Vaginitis: POSITIVE — AB
Comment: NEGATIVE
Comment: NEGATIVE
Comment: NEGATIVE

## 2022-04-18 MED ORDER — TERCONAZOLE 0.4 % VA CREA: 1.0000 | TOPICAL_CREAM | Freq: Every day | VAGINAL | 0 refills | Status: DC

## 2022-04-18 NOTE — Addendum Note (Signed)
Addended by: Marny Lowenstein on: 04/18/2022 04:53 PM   Modules accepted: Orders

## 2022-04-19 ENCOUNTER — Ambulatory Visit (INDEPENDENT_AMBULATORY_CARE_PROVIDER_SITE_OTHER): Payer: Medicaid Other | Admitting: Clinical

## 2022-04-19 DIAGNOSIS — F4323 Adjustment disorder with mixed anxiety and depressed mood: Secondary | ICD-10-CM | POA: Diagnosis not present

## 2022-04-19 DIAGNOSIS — F909 Attention-deficit hyperactivity disorder, unspecified type: Secondary | ICD-10-CM

## 2022-04-19 DIAGNOSIS — Z658 Other specified problems related to psychosocial circumstances: Secondary | ICD-10-CM

## 2022-04-25 NOTE — BH Specialist Note (Signed)
Integrated Behavioral Health via Telemedicine Visit  05/07/2022 Hayley Lawson [id number]  Number of Integrated Behavioral Health Clinician visits: 3- Third Visit  Session Start time: 1319   Session End time: 1356  Total time in minutes: 37  Referring Provider: Valentina Shaggy, MD Patient/Family location: Home [facility] Provider location: Center for [facility] Healthcare at [facility] for Women  All persons participating in visit: Patient Hayley Lawson and [facility] Verland Sprinkle   Types of Service: Individual psychotherapy and Telephone visit  I connected with Hayley Lawson and/or [name]  n/a  via  Telephone or Engineer, civil (consulting)  (Video is Caregility application) and verified that I am speaking with the correct person using two identifiers. Discussed confidentiality: Yes   I discussed the limitations of telemedicine and the availability of in person appointments.  Discussed there is a possibility of technology failure and discussed alternative modes of communication if that failure occurs.  I discussed that engaging in this telemedicine visit, they consent to the provision of behavioral healthcare and the services will be billed under their insurance.  Patient and/or legal guardian expressed understanding and consented to Telemedicine visit: Yes   Presenting Concerns: Patient and/or family reports the following symptoms/concerns: Conflict with partner over his inability to set boundaries with family members; feeling overwhelmed over continual demands from others requesting help (with money, car rides, etc.via calls, texts, knocks on the door), including when she and the children were all home with the flu and/or trying to sleep.  Duration of problem: Ongoing; Severity of problem:  moderately severe  Patient and/or Family's Strengths/Protective Factors: Concrete supports in place (healthy food, safe environments, etc.), Sense of purpose, and Physical  Health (exercise, healthy diet, medication compliance, etc.)  Goals Addressed: Patient will:  Reduce symptoms of: anxiety, depression, and stress   Increase knowledge and/or ability of: stress reduction   Demonstrate ability to: Increase healthy adjustment to current life circumstances  Progress towards Goals: Ongoing  Interventions: Interventions utilized:  Supportive Reflection Standardized Assessments completed: Not Needed  Patient and/or Family Response: Patient agrees with treatment plan.   Assessment: Patient currently experiencing Adjustment disorder with mixed anxiety and depressed mood, ADHD; Psychosocial stress.   Patient may benefit from continued therapeutic intervention.  Plan: Follow up with behavioral health clinician on : Two weeks Behavioral recommendations:  -Continue taking prenatal vitamin as prescribed -Continue daily journal, meditation, prioritizing things within realm of control -Continue setting healthy boundaries with others; consider silencing phone during specific hours nightly. Let partner and family know the hours you are and aren't available, to preserve peace and adequate rest.  Referral(s): Integrated [employer] (In Clinic)  I discussed the assessment and treatment plan with the patient and/or parent/guardian. They were provided an opportunity to ask questions and all were answered. They agreed with the plan and demonstrated an understanding of the instructions.   They were advised to call back or seek an in-person evaluation if the symptoms worsen or if the condition fails to improve as anticipated.  Rae Lips, LCSW     04/05/2022   10:34 AM 12/07/2021    1:24 PM 11/08/2021    9:54 AM 09/05/2021    9:21 AM  Depression screen PHQ 2/9  Decreased Interest 2 0 0 1  Down, Depressed, Hopeless 3 0 1 1  PHQ - 2 Score 5 0 1 2  Altered sleeping 3 0 0 0  Tired, decreased energy 3 0 1 1  Change in appetite 2 0 1 1  Feeling bad  or failure about yourself  1 0 0 0  Trouble concentrating 3 0 3 1  Moving slowly or fidgety/restless 0 0 0 0  Suicidal thoughts 0 0 0 0  PHQ-9 Score 17 0 6 5  Difficult doing work/chores  Not difficult at all Extremely dIfficult Somewhat difficult      04/05/2022   10:36 AM 11/08/2021    9:55 AM  GAD 7 : Generalized Anxiety Score  Nervous, Anxious, on Edge 1 1  Control/stop worrying 3 1  Worry too much - different things 3 1  Trouble relaxing 3 0  Restless 3 0  Easily annoyed or irritable 2 1  Afraid - awful might happen 3 0  Total GAD 7 Score 18 4  Anxiety Difficulty  Somewhat difficult

## 2022-04-27 ENCOUNTER — Telehealth: Payer: Medicaid Other | Admitting: Physician Assistant

## 2022-04-27 DIAGNOSIS — J111 Influenza due to unidentified influenza virus with other respiratory manifestations: Secondary | ICD-10-CM | POA: Diagnosis not present

## 2022-04-27 MED ORDER — ALBUTEROL SULFATE HFA 108 (90 BASE) MCG/ACT IN AERS: 2.0000 | INHALATION_SPRAY | Freq: Four times a day (QID) | RESPIRATORY_TRACT | 0 refills | Status: DC | PRN

## 2022-04-27 MED ORDER — OSELTAMIVIR PHOSPHATE 75 MG PO CAPS: 75.0000 mg | ORAL_CAPSULE | Freq: Two times a day (BID) | ORAL | 0 refills | Status: DC

## 2022-04-27 NOTE — Addendum Note (Signed)
Addended by: Waldon Merl on: 04/27/2022 09:54 AM   Modules accepted: Orders

## 2022-04-27 NOTE — Progress Notes (Signed)
Virtual Visit Consent   Hayley Lawson, you are scheduled for a virtual visit with a [facility] provider today. Just as with appointments in the office, your consent must be obtained to participate. Your consent will be active for this visit and any virtual visit you may have with one of our providers in the next 365 days. If you have a MyChart account, a copy of this consent can be sent to you electronically.  As this is a virtual visit, video technology does not allow for your provider to perform a traditional examination. This may limit your provider's ability to fully assess your condition. If your provider identifies any concerns that need to be evaluated in person or the need to arrange testing (such as labs, EKG, etc.), we will make arrangements to do so. Although advances in technology are sophisticated, we cannot ensure that it will always work on either your end or our end. If the connection with a video visit is poor, the visit may have to be switched to a telephone visit. With either a video or telephone visit, we are not always able to ensure that we have a secure connection.  By engaging in this virtual visit, you consent to the provision of healthcare and authorize for your insurance to be billed (if applicable) for the services provided during this visit. Depending on your insurance coverage, you may receive a charge related to this service.  I need to obtain your verbal consent now. Are you willing to proceed with your visit today? [name] has provided verbal consent on 04/27/2022 for a virtual visit (video or telephone). [name], [place]  Date: 04/27/2022 9:10 AM  Virtual Visit via Video Note   I, [name], connected with  Sharyon Medicus  ([id number], [birth year]) on 04/27/22 at  8:45 AM EST by a video-enabled telemedicine application and verified that I am speaking with the correct person using two identifiers.  Location: Patient: Virtual Visit Location  Patient: Home Provider: Virtual Visit Location Provider: Home Office   I discussed the limitations of evaluation and management by telemedicine and the availability of in person appointments. The patient expressed understanding and agreed to proceed.    History of Present Illness: Hayley Lawson is a 26 y.o. who identifies as a female who was assigned female at birth, and is being seen today for concern of flu B. Daughter tested positive for this in the past couple of days at pediatricians office. Patient notes symptoms starting last night with cough, congestion, chest tightness, headache, fever and fatigue. Denies chest pain. Denies overt shortness of breath. Denies GI symptoms. Is currently 4 months pregnant.  HPI: HPI  Problems:  Patient Active Problem List   Diagnosis Date Noted   History of gestational diabetes in prior pregnancy, currently pregnant in first trimester 04/12/2022   Supervision of normal pregnancy 03/12/2022   Migraines 09/05/2021   ADHD 09/05/2021    Allergies:  Allergies  Allergen Reactions   Bactrim [Sulfamethoxazole-Trimethoprim] Rash   Doxycycline Rash   Medications:  Current Outpatient Medications:    aspirin EC 81 MG tablet, Take 1 tablet (81 mg total) by mouth daily. Swallow whole., Disp: 30 tablet, Rfl: 12   promethazine (PHENERGAN) 25 MG tablet, Take 1 tablet (25 mg total) by mouth every 6 (six) hours as needed for nausea or vomiting., Disp: 30 tablet, Rfl: 1  Observations/Objective: Patient is well-developed, well-nourished in no acute distress.  Resting comfortably at home.  Head is normocephalic, atraumatic.  No labored  breathing. Speech is clear and coherent with logical content.  Patient is alert and oriented at baseline.   Assessment and Plan: 1. Influenza   Classic influenza symptoms. Known exposure to Flu B (Daughter). Supportive measures, OTC medications safe for pregnancy and Vitamin recommendations reviewed. Will start Tamiflu per  orders. Albuterol sent in. Quarantine reviewed with patient.    Follow Up Instructions: I discussed the assessment and treatment plan with the patient. The patient was provided an opportunity to ask questions and all were answered. The patient agreed with the plan and demonstrated an understanding of the instructions.  A copy of instructions were sent to the patient via MyChart unless otherwise noted below.   The patient was advised to call back or seek an in-person evaluation if the symptoms worsen or if the condition fails to improve as anticipated.  Time:  I spent 10 minutes with the patient via telehealth technology discussing the above problems/concerns.    [name], PA-C

## 2022-04-27 NOTE — Patient Instructions (Signed)
Hayley Lawson, thank you for joining [name], PA-C for today's virtual visit.  While this provider is not your primary care provider (PCP), if your PCP is located in our provider database this encounter information will be shared with them immediately following your visit.   A [facility] MyChart account gives you access to today's visit and all your visits, tests, and labs performed at [facility] " click here if you don't have a [facility] MyChart account or go to [email or web address]  Consent: (Patient) Hayley Lawson provided verbal consent for this virtual visit at the beginning of the encounter.  Current Medications:  Current Outpatient Medications:    aspirin EC 81 MG tablet, Take 1 tablet (81 mg total) by mouth daily. Swallow whole., Disp: 30 tablet, Rfl: 12   promethazine (PHENERGAN) 25 MG tablet, Take 1 tablet (25 mg total) by mouth every 6 (six) hours as needed for nausea or vomiting., Disp: 30 tablet, Rfl: 1   Medications ordered in this encounter:  No orders of the defined types were placed in this encounter.    *If you need refills on other medications prior to your next appointment, please contact your pharmacy*  Follow-Up: Call back or seek an in-person evaluation if the symptoms worsen or if the condition fails to improve as anticipated.  [facility] [phone]  Other Instructions Please keep well-hydrated and try to get plenty of rest. If you have a humidifier, place it in the bedroom and run it at night. Start a saline nasal rinse for nasal congestion. You can alternate between Tylenol if needed for fever, body aches, headache and/or throat pain. Salt water-gargles and chloraseptic spray can be very beneficial for sore throat. Mucinex-DM for congestion or cough. Please take all prescribed medications as directed.  Remain out of work until [employer] for 24 hours without a fever-reducing medication, and you are  feeling better.  You should mask until symptoms are resolved.  If anything worsens despite treatment, you need to be evaluated in-person. Please do not delay care.  Influenza, Adult Influenza is also called "the flu." It is an infection in the lungs, nose, and throat (respiratory tract). It spreads easily from person to person (is contagious). The flu causes symptoms that are like a cold, along with high fever and body aches. What are the causes? This condition is caused by the influenza virus. You can get the virus by: Breathing in droplets that are in the air after a person infected with the flu coughed or sneezed. Touching something that has the virus on it and then touching your mouth, nose, or eyes. What increases the risk? Certain things may make you more likely to get the flu. These include: Not washing your hands often. Having close contact with many people during cold and flu season. Touching your mouth, eyes, or nose without first washing your hands. Not getting a flu shot every year. You may have a higher risk for the flu, and serious problems, such as a lung infection (pneumonia), if you: Are older than 65. Are pregnant. Have a weakened disease-fighting system (immune system) because of a disease or because you are taking certain medicines. Have a long-term (chronic) condition, such as: Heart, kidney, or lung disease. Diabetes. Asthma. Have a liver disorder. Are very overweight (morbidly obese). Have anemia. What are the signs or symptoms? Symptoms usually begin suddenly and last 4-14 days. They may include: Fever and chills. Headaches, body aches, or muscle aches. Sore throat. Cough. Runny  or stuffy (congested) nose. Feeling discomfort in your chest. Not wanting to eat as much as normal. Feeling weak or tired. Feeling dizzy. Feeling sick to your stomach or throwing up. How is this treated? If the flu is found early, you can be treated with antiviral medicine. This  can help to reduce how bad the illness is and how long it lasts. This may be given by mouth or through an IV tube. Taking care of yourself at home can help your symptoms get better. Your doctor may want you to: Take over-the-counter medicines. Drink plenty of fluids. The flu often goes away on its own. If you have very bad symptoms or other problems, you may be treated in a hospital. Follow these instructions at home:     Activity Rest as needed. Get plenty of sleep. Stay home from work or school as told by your doctor. Do not leave home until you do not have a fever for 24 hours without taking medicine. Leave home only to go to your doctor. Eating and drinking Take an ORS (oral rehydration solution). This is a drink that is sold at pharmacies and stores. Drink enough fluid to keep your pee pale yellow. Drink clear fluids in small amounts as you are able. Clear fluids include: Water. Ice chips. Fruit juice mixed with water. Low-calorie sports drinks. Eat bland foods that are easy to digest. Eat small amounts as you are able. These foods include: Bananas. Applesauce. Rice. Lean meats. Toast. Crackers. Do not eat or drink: Fluids that have a lot of sugar or caffeine. Alcohol. Spicy or fatty foods. General instructions Take over-the-counter and prescription medicines only as told by your doctor. Use a cool mist humidifier to add moisture to the air in your home. This can make it easier for you to breathe. When using a cool mist humidifier, clean it daily. Empty water and replace with clean water. Cover your mouth and nose when you cough or sneeze. Wash your hands with soap and water often and for at least 20 seconds. This is also important after you cough or sneeze. If you cannot use soap and water, use alcohol-based hand sanitizer. Keep all follow-up visits. How is this prevented?  Get a flu shot every year. You may get the flu shot in late summer, fall, or winter. Ask your  doctor when you should get your flu shot. Avoid contact with people who are sick during fall and winter. This is cold and flu season. Contact a doctor if: You get new symptoms. You have: Chest pain. Watery poop (diarrhea). A fever. Your cough gets worse. You start to have more mucus. You feel sick to your stomach. You throw up. Get help right away if you: Have shortness of breath. Have trouble breathing. Have skin or nails that turn a bluish color. Have very bad pain or stiffness in your neck. Get a sudden headache. Get sudden pain in your face or ear. Cannot eat or drink without throwing up. These symptoms may represent a serious problem that is an emergency. Get medical help right away. Call your local emergency services (911 in the [place] Do not wait to see if the symptoms will go away. Do not drive yourself to the hospital. Summary Influenza is also called "the flu." It is an infection in the lungs, nose, and throat. It spreads easily from person to person. Take over-the-counter and prescription medicines only as told by your doctor. Getting a flu shot every year is the best way to  not get the flu. This information is not intended to replace advice given to you by your health care provider. Make sure you discuss any questions you have with your health care provider. Document Revised: 12/04/2019 Document Reviewed: 12/04/2019 Elsevier Patient Education  2023 Elsevier Inc.      If you have been instructed to have an in-person evaluation today at a local Urgent Care facility, please use the link below. It will take you to a list of all of our available [facility], including address, phone number and hours of operation. Please do not delay care.  [facility]  If you or a family member do not have a primary care provider, use the link below to schedule a visit and establish care. When you choose a [facility] primary care physician or advanced practice  provider, you gain a long-term partner in health. Find a Primary Care Provider  Learn more about [facility]'s in-office and virtual care options: [place] - Get Care Now

## 2022-04-28 ENCOUNTER — Inpatient Hospital Stay (HOSPITAL_COMMUNITY): Payer: Medicaid Other

## 2022-04-28 ENCOUNTER — Other Ambulatory Visit: Payer: Self-pay

## 2022-04-28 ENCOUNTER — Inpatient Hospital Stay (HOSPITAL_COMMUNITY)
Admission: AD | Admit: 2022-04-28 | Discharge: 2022-04-28 | Disposition: A | Discharge status: Home / Self Care | Payer: Medicaid Other | Attending: Obstetrics and Gynecology | Admitting: Obstetrics and Gynecology

## 2022-04-28 DIAGNOSIS — R058 Other specified cough: Secondary | ICD-10-CM | POA: Insufficient documentation

## 2022-04-28 DIAGNOSIS — Z7951 Long term (current) use of inhaled steroids: Secondary | ICD-10-CM | POA: Insufficient documentation

## 2022-04-28 DIAGNOSIS — Z1152 Encounter for screening for COVID-19: Secondary | ICD-10-CM | POA: Diagnosis not present

## 2022-04-28 DIAGNOSIS — O99512 Diseases of the respiratory system complicating pregnancy, second trimester: Secondary | ICD-10-CM | POA: Diagnosis present

## 2022-04-28 DIAGNOSIS — J101 Influenza due to other identified influenza virus with other respiratory manifestations: Secondary | ICD-10-CM | POA: Diagnosis not present

## 2022-04-28 DIAGNOSIS — Z7982 Long term (current) use of aspirin: Secondary | ICD-10-CM | POA: Diagnosis not present

## 2022-04-28 DIAGNOSIS — Z79899 Other long term (current) drug therapy: Secondary | ICD-10-CM | POA: Insufficient documentation

## 2022-04-28 DIAGNOSIS — Z3A15 15 weeks gestation of pregnancy: Secondary | ICD-10-CM | POA: Diagnosis not present

## 2022-04-28 LAB — RESP PANEL BY RT-PCR (RSV, FLU A&B, COVID)  RVPGX2
Influenza A by PCR: NEGATIVE
Influenza B by PCR: POSITIVE — AB
Resp Syncytial Virus by PCR: NEGATIVE
SARS Coronavirus 2 by RT PCR: NEGATIVE

## 2022-04-28 NOTE — MAU Note (Signed)
.  Hayley Lawson is a 26 y.o. at [redacted]w[redacted]d here in MAU reporting: daughter had flu and feels like she got the flu. Have been using inhaler all day long - 2 puffs every 6 hours for SOB - reports gasping for air throughout the night, chest tightness, blurry vision, and dizziness. Denies any OB complaints.   Onset of complaint: 2 days ago.  Pain score: 5 - chest tightness  Vitals:   04/28/22 2003  BP: 121/80  Pulse: (!) 113  Resp: 17  Temp: 98.2 F (36.8 C)  SpO2: 97%     FHT:161 Lab orders placed from triage:  none

## 2022-04-28 NOTE — MAU Provider Note (Signed)
History     CSN: [id number]  Arrival date and time: 04/28/22 1944   Event Date/Time   First Provider Initiated Contact with Patient 04/28/22 2016      Chief Complaint  Patient presents with   Shortness of Breath   26 y.o. G4P3003 @15 .5 wks presenting with cough, congestion, chest tightness. Reports onset of sx 2 days ago. Cough is productive. Felt hot and chills, did not check temp. She had a virtual appt yesterday and given Tamiflu and inhaler. States inhaler doesn't last long. States her daughter had the flu. No pregnancy complaints.    OB History     Gravida  4   Para  3   Term  3   Preterm      AB      Living  3      SAB      IAB      Ectopic      Multiple      Live Births  3           Past Medical History:  Diagnosis Date   ADHD    Gestational diabetes     Past Surgical History:  Procedure Laterality Date   CHOLECYSTECTOMY  2019   NO PAST SURGERIES      Family History  Problem Relation Age of Onset   Migraines Mother    Stroke Father    Depression Father    Anxiety disorder Father    Diabetes Father    Heart disease Maternal Grandmother    Alcohol abuse Neg Hx    Arthritis Neg Hx    Asthma Neg Hx    Birth defects Neg Hx    COPD Neg Hx    Cancer Neg Hx    Drug abuse Neg Hx    Early death Neg Hx    Hearing loss Neg Hx    Hyperlipidemia Neg Hx    Hypertension Neg Hx    Kidney disease Neg Hx    Learning disabilities Neg Hx    Mental illness Neg Hx    Mental retardation Neg Hx    Miscarriages / Stillbirths Neg Hx    Vision loss Neg Hx    Varicose Veins Neg Hx     Social History   Tobacco Use   Smoking status: Former   Smokeless tobacco: Never  2020 Use: Never used  Substance Use Topics   Alcohol use: No   Drug use: No    Allergies:  Allergies  Allergen Reactions   Bactrim [Sulfamethoxazole-Trimethoprim] Rash   Doxycycline Rash    No medications prior to admission.    Review of Systems   Constitutional:  Positive for chills. Negative for fever.  HENT:  Positive for congestion.   Respiratory:  Positive for cough, chest tightness and shortness of breath.   Gastrointestinal:  Negative for abdominal pain.   Physical Exam   Blood pressure 113/79, pulse (!) 104, temperature 98.2 F (36.8 C), temperature source Oral, resp. rate 17, height 5\' 2"  (1.575 m), weight 76.7 kg, last menstrual period 01/08/2022, SpO2 97 %.  Physical Exam Constitutional:      General: She is not in acute distress.    Appearance: Normal appearance.  HENT:     Head: Normocephalic and atraumatic.  Cardiovascular:     Rate and Rhythm: Regular rhythm. Tachycardia present.     Heart sounds: Normal heart sounds.  Pulmonary:     Effort: Pulmonary effort is normal. No respiratory  distress.     Breath sounds: Normal breath sounds. No stridor. No wheezing, rhonchi or rales.  Musculoskeletal:        General: Normal range of motion.     Cervical back: Normal range of motion.  Skin:    General: Skin is warm and dry.  Neurological:     General: No focal deficit present.     Mental Status: She is alert and oriented to person, place, and time.  Psychiatric:        Mood and Affect: Mood normal.        Behavior: Behavior normal.    Results for orders placed or performed during the hospital encounter of 04/28/22 (from the past 24 hour(s))  Resp panel by RT-PCR (RSV, Flu A&B, Covid) Anterior Nasal Swab     Status: Abnormal   Collection Time: 04/28/22  8:22 PM   Specimen: Anterior Nasal Swab  Result Value Ref Range   SARS Coronavirus 2 by RT PCR NEGATIVE NEGATIVE   Influenza A by PCR NEGATIVE NEGATIVE   Influenza B by PCR POSITIVE (A) NEGATIVE   Resp Syncytial Virus by PCR NEGATIVE NEGATIVE    DG Chest Port 1 View  Result Date: 04/28/2022 CLINICAL DATA:  Chest pain, cough EXAM: PORTABLE CHEST 1 VIEW COMPARISON:  None Available. FINDINGS: The heart size and mediastinal contours are within normal limits.  Both lungs are clear. The visualized skeletal structures are unremarkable. IMPRESSION: No active disease. Electronically Signed   By: Ernie Avena M.D.   On: 04/28/2022 20:43    MAU Course  Procedures  MDM Labs ordered and reviewed.   Assessment and Plan   1. [redacted] weeks gestation of pregnancy   2. Influenza B    Discharge home Follow up at [facility] as scheduled Continue and finish Tamiflu Return precautions  Allergies as of 04/28/2022       Reactions   Bactrim [sulfamethoxazole-trimethoprim] Rash   Doxycycline Rash        Medication List     TAKE these medications    albuterol 108 (90 Base) MCG/ACT inhaler Commonly known as: VENTOLIN HFA Inhale 2 puffs into the lungs every 6 (six) hours as needed for wheezing or shortness of breath.   aspirin EC 81 MG tablet Take 1 tablet (81 mg total) by mouth daily. Swallow whole.   oseltamivir 75 MG capsule Commonly known as: TAMIFLU Take 1 capsule (75 mg total) by mouth 2 (two) times daily.   promethazine 25 MG tablet Commonly known as: PHENERGAN Take 1 tablet (25 mg total) by mouth every 6 (six) hours as needed for nausea or vomiting.         Donette Larry, CNM 04/28/2022, 9:36 PM

## 2022-04-28 NOTE — Discharge Instructions (Signed)

## 2022-04-30 NOTE — L&D Delivery Note (Signed)
Delivery Note Labored in the water for a while until complete.  Requested to get out of tub.  Then noted to be completely dilated and pushed only a few times to delivery.   At 3:18 AM a viable and healthy female "Hayley Lawson" was delivered via Vaginal, Spontaneous (Presentation:   Occiput Anterior).  APGAR: 9, 9; weight  .   Placenta status: Spontaneous, Intact.  Cord: 3 vessels with the following complications: Nuchal x 1, delivered through  Anesthesia: None Episiotomy: None Lacerations: 1st degree Suture Repair: 3.0 vicryl rapide Est. Blood Loss (mL):  217  Mom to postpartum.  Baby to Couplet care / Skin to Skin.  Wynelle Bourgeois 10/11/2022, 3:56 AM

## 2022-05-07 ENCOUNTER — Ambulatory Visit (INDEPENDENT_AMBULATORY_CARE_PROVIDER_SITE_OTHER): Payer: Medicaid Other | Admitting: Clinical

## 2022-05-07 DIAGNOSIS — F909 Attention-deficit hyperactivity disorder, unspecified type: Secondary | ICD-10-CM | POA: Diagnosis not present

## 2022-05-07 DIAGNOSIS — Z658 Other specified problems related to psychosocial circumstances: Secondary | ICD-10-CM

## 2022-05-07 DIAGNOSIS — F4323 Adjustment disorder with mixed anxiety and depressed mood: Secondary | ICD-10-CM | POA: Diagnosis not present

## 2022-05-08 ENCOUNTER — Encounter (HOSPITAL_BASED_OUTPATIENT_CLINIC_OR_DEPARTMENT_OTHER): Payer: Self-pay | Admitting: Advanced Practice Midwife

## 2022-05-08 ENCOUNTER — Ambulatory Visit: Payer: Medicaid Other | Admitting: Nurse Practitioner

## 2022-05-08 ENCOUNTER — Ambulatory Visit (INDEPENDENT_AMBULATORY_CARE_PROVIDER_SITE_OTHER): Payer: Medicaid Other | Admitting: Advanced Practice Midwife

## 2022-05-08 VITALS — BP 118/83 | HR 94 | Wt 172.8 lb

## 2022-05-08 DIAGNOSIS — Z3482 Encounter for supervision of other normal pregnancy, second trimester: Secondary | ICD-10-CM

## 2022-05-08 DIAGNOSIS — Z3A17 17 weeks gestation of pregnancy: Secondary | ICD-10-CM

## 2022-05-08 DIAGNOSIS — F909 Attention-deficit hyperactivity disorder, unspecified type: Secondary | ICD-10-CM

## 2022-05-08 NOTE — Progress Notes (Signed)
   PRENATAL VISIT NOTE  Subjective:  Hayley Lawson is a 27 y.o. G4P3003 at [redacted]w[redacted]d being seen today for ongoing prenatal care.  She is currently monitored for the following issues for this low-risk pregnancy and has Migraines; ADHD; Supervision of normal pregnancy; and History of gestational diabetes in prior pregnancy, currently pregnant in first trimester on their problem list.  Patient reports no complaints.  Contractions: Not present. Vag. Bleeding: None.  Movement: Present. Denies leaking of fluid.   The following portions of the patient's history were reviewed and updated as appropriate: allergies, current medications, past family history, past medical history, past social history, past surgical history and problem list.   Objective:   Vitals:   05/08/22 1041  BP: 118/83  Pulse: 94  Weight: 172 lb 12.8 oz (78.4 kg)    Fetal Status: Fetal Heart Rate (bpm): 152   Movement: Present     General:  Alert, oriented and cooperative. Patient is in no acute distress.  Skin: Skin is warm and dry. No rash noted.   Cardiovascular: Normal heart rate noted  Respiratory: Normal respiratory effort, no problems with respiration noted  Abdomen: Soft, gravid, appropriate for gestational age.  Pain/Pressure: Absent     Pelvic: Cervical exam deferred        Extremities: Normal range of motion.  Edema: None  Mental Status: Normal mood and affect. Normal behavior. Normal judgment and thought content.   Assessment and Plan:  Pregnancy: G4P3003 at [redacted]w[redacted]d 1. Encounter for supervision of other normal pregnancy in second trimester --Anticipatory guidance about next visits/weeks of pregnancy given.  - Pt is interested in waterbirth.  No contraindications at this time per chart review/patient assessment.   - Pt to enroll in class, see CNMs for most visits in the office.  - Discussed waterbirth as option for low-risk pregnancy.  Reviewed conditions that may arise during pregnancy that will risk pt out of  waterbirth including hypertension, diabetes, fetal growth restriction <10%ile, etc.  - AFP, Serum, Open Spina Bifida  2. [redacted] weeks gestation of pregnancy   3. Attention deficit hyperactivity disorder (ADHD), unspecified ADHD type --Pt takes Adderall XR, has been on the medication for a long time. She does not do well at work without it.  --Up-to-date notes amphetamines as safest option with most data.  There may also be alternatives with more safety evidence, like Wellbutrin, but these are likely less effective than stimulants for ADHD symptoms.  I referred patient to behavioral health to further discuss medications and alternatives and decide whether benefits for her outweigh the risks of the medication.   Preterm labor symptoms and general obstetric precautions including but not limited to vaginal bleeding, contractions, leaking of fluid and fetal movement were reviewed in detail with the patient. Please refer to After Visit Summary for other counseling recommendations.   No follow-ups on file.  Future Appointments  Date Time Provider [place]  05/21/2022  9:45 AM [facility] US4 [facility]  05/23/2022 10:45 AM [facility]IC[facility]    Fatima Blank, CNM

## 2022-05-08 NOTE — Progress Notes (Deleted)
Assessment    Patient profile:  Hayley Lawson is a 27 y.o. year old female , new to the practice with a past medical history of ADHD.  See PMH / PSH for additional history.  Patient referred by PCP for hematemesis.    # 27 yo female, [redacted] weeks gestation   Plan:        HPI:    Chief Complaint:   Interval history:   Previous Labs / Imaging::    Latest Ref Rng & Units 03/19/2022    9:37 AM 02/28/2022   12:00 AM 11/08/2021   10:23 AM  CBC  WBC 3.4 - 10.8 x10E3/uL 8.4   7.3   Hemoglobin 11.1 - 15.9 g/dL 60.7  37.1     06.2   Hematocrit 34.0 - 46.6 % 38.9  38     40.4   Platelets 150 - 450 x10E3/uL 428  385     376      This result is from an external source.    No results found for: "LIPASE"    Latest Ref Rng & Units 11/08/2021   10:23 AM 08/17/2021    3:06 PM 04/19/2019   10:51 PM  CMP  Glucose 70 - 99 mg/dL 82  76  694   BUN 6 - 20 mg/dL 14  9  12    Creatinine 0.57 - 1.00 mg/dL  8.54  6.27   Sodium 134 - 144 mmol/L 136  139  140   Potassium 3.5 - 5.2 mmol/L 4.7  4.5  3.7   Chloride 96 - 106 mmol/L 101  103  104   CO2 20 - 29 mmol/L 18  23    Calcium 8.7 - 10.2 mg/dL 9.7  9.5    Total Protein 6.0 - 8.5 g/dL 7.3  6.9    Total Bilirubin 0.0 - 1.2 mg/dL 0.6  0.5    Alkaline Phos 44 - 121 IU/L 57  57    AST 0 - 40 IU/L 21  21    ALT 0 - 32 IU/L 32  31        Previous GI Evaluation     Imaging:  DG Chest Port 1 View CLINICAL DATA:  Chest pain, cough  EXAM: PORTABLE CHEST 1 VIEW  COMPARISON:  None Available.  FINDINGS: The heart size and mediastinal contours are within normal limits. Both lungs are clear. The visualized skeletal structures are unremarkable.  IMPRESSION: No active disease.  Electronically Signed   By: 0.35 M.D.   On: 04/28/2022 20:43    Past Medical History:  Diagnosis Date   ADHD    Gestational diabetes    Past Surgical History:  Procedure Laterality Date   CHOLECYSTECTOMY  2019   Family  History  Problem Relation Age of Onset   Migraines Mother    Stroke Father    Depression Father    Anxiety disorder Father    Diabetes Father    Heart disease Maternal Grandmother    Alcohol abuse Neg Hx    Arthritis Neg Hx    Asthma Neg Hx    Birth defects Neg Hx    COPD Neg Hx    Cancer Neg Hx    Drug abuse Neg Hx    Early death Neg Hx    Hearing loss Neg Hx    Hyperlipidemia Neg Hx    Hypertension Neg Hx    Kidney disease Neg Hx    Learning disabilities Neg Hx  Mental illness Neg Hx    Mental retardation Neg Hx    Miscarriages / Stillbirths Neg Hx    Vision loss Neg Hx    Varicose Veins Neg Hx    Social History   Tobacco Use   Smoking status: Former   Smokeless tobacco: Never  Scientific laboratory technician Use: Never used  Substance Use Topics   Alcohol use: No   Drug use: No   Current Outpatient Medications  Medication Sig Dispense Refill   albuterol (VENTOLIN HFA) 108 (90 Base) MCG/ACT inhaler Inhale 2 puffs into the lungs every 6 (six) hours as needed for wheezing or shortness of breath. 8 g 0   aspirin EC 81 MG tablet Take 1 tablet (81 mg total) by mouth daily. Swallow whole. 30 tablet 12   oseltamivir (TAMIFLU) 75 MG capsule Take 1 capsule (75 mg total) by mouth 2 (two) times daily. 10 capsule 0   promethazine (PHENERGAN) 25 MG tablet Take 1 tablet (25 mg total) by mouth every 6 (six) hours as needed for nausea or vomiting. 30 tablet 1   No current facility-administered medications for this visit.   Allergies  Allergen Reactions   Bactrim [Sulfamethoxazole-Trimethoprim] Rash   Doxycycline Rash     Review of Systems: Positive for ***.  All other systems reviewed and negative except where noted in HPI.   Wt Readings from Last 3 Encounters:  04/28/22 169 lb 3.2 oz (76.7 kg)  04/12/22 169 lb 6.4 oz (76.8 kg)  03/12/22 167 lb (75.8 kg)    Physical Exam   LMP 01/08/2022  Constitutional:  Generally well appearing ***female in no acute distress. Psychiatric:  Pleasant. Normal mood and affect. Behavior is normal. EENT: Pupils normal.  Conjunctivae are normal. No scleral icterus. Neck supple.  Cardiovascular: Normal rate, regular rhythm.  Pulmonary/chest: Effort normal and breath sounds normal. No wheezing, rales or rhonchi. Abdominal: Soft, nondistended, nontender. Bowel sounds active throughout. There are no masses palpable. No hepatomegaly. Neurological: Alert and oriented to person place and time. Extremities: No edema Skin: Skin is warm and dry. No rashes noted.  Tye Savoy, NP  05/08/2022, 10:22 AM  Cc:  Referring Provider de [place], Raymond J, MD

## 2022-05-10 NOTE — BH Specialist Note (Signed)
Pt did not arrive to video visit and did not answer the phone; Left HIPPA-compliant message to call back Casaundra Takacs from [facility] at  [phone] (Kamille Toomey's office).  ?; left MyChart message for patient.  ? ?

## 2022-05-12 LAB — AFP, SERUM, OPEN SPINA BIFIDA
AFP MoM: 0.6
AFP Value: 23.5 ng/mL
Gest. Age on Collection Date: 17 weeks
Maternal Age At EDD: 28.4 yr
OSBR Risk 1 IN: 10000
Test Results:: NEGATIVE
Weight: 172 [lb_av]

## 2022-05-14 ENCOUNTER — Ambulatory Visit (INDEPENDENT_AMBULATORY_CARE_PROVIDER_SITE_OTHER): Payer: Medicaid Other | Admitting: Family Medicine

## 2022-05-14 ENCOUNTER — Encounter (HOSPITAL_BASED_OUTPATIENT_CLINIC_OR_DEPARTMENT_OTHER): Payer: Self-pay | Admitting: Family Medicine

## 2022-05-14 DIAGNOSIS — F902 Attention-deficit hyperactivity disorder, combined type: Secondary | ICD-10-CM | POA: Diagnosis not present

## 2022-05-14 MED ORDER — AMPHETAMINE-DEXTROAMPHET ER 20 MG PO CP24: 20.0000 mg | ORAL_CAPSULE | ORAL | 0 refills | Status: DC

## 2022-05-14 NOTE — Assessment & Plan Note (Signed)
Patient presents for follow-up of medication check.  She reports that she has been doing well, denies any issues with chest pain, palpitations, appetite concerns, sleep concerns.  She is currently pregnant and did discuss medication management with her OB/GYN.  On review of chart, it does appear that with discussion of medication, it was reviewed that generally continue with medication does appear to be safe, however there is potential concern for complications such as cardiac or other anomalies.  Most recent OB/GYN did recommend patient having appointment arranged with behavioral health specialist to further review management considerations related to ADHD and pregnancy.  She is waiting for further information about scheduling this PDMP reviewed, no red flags.  She does need refill today, refill sent to pharmacy on file.  Will plan for follow-up in about 3 months or sooner as needed

## 2022-05-14 NOTE — Progress Notes (Signed)
   Virtual Visit via Telephone   I connected with  Sharyon Medicus  on 05/14/22 by telephone/telehealth and verified that I am speaking with the correct person using two identifiers.   I discussed the limitations, risks, security and privacy concerns of performing an evaluation and management service by telephone, including the higher likelihood of inaccurate diagnosis and treatment, and the availability of in person appointments.  We also discussed the likely need of an additional face to face encounter for complete and high quality delivery of care.  I also discussed with the patient that there may be a patient responsible charge related to this service. The patient expressed understanding and wishes to proceed.  Provider location is in medical facility. Patient location is at their home, different from provider location. People involved in care of the patient during this telehealth encounter were myself, my nurse/medical assistant, and my front office/scheduling team member.  Review of Systems: No fevers, chills, night sweats, weight loss, chest pain, or shortness of breath.   Objective Findings:    General: Speaking full sentences, no audible heavy breathing.  Sounds alert and appropriately interactive.    Independent interpretation of tests performed by another provider:   None.  Brief History, Exam, Impression, and Recommendations:    ADHD Patient presents for follow-up of medication check.  She reports that she has been doing well, denies any issues with chest pain, palpitations, appetite concerns, sleep concerns.  She is currently pregnant and did discuss medication management with her OB/GYN.  On review of chart, it does appear that with discussion of medication, it was reviewed that generally continue with medication does appear to be safe, however there is potential concern for complications such as cardiac or other anomalies.  Most recent OB/GYN did recommend patient having  appointment arranged with behavioral health specialist to further review management considerations related to ADHD and pregnancy.  She is waiting for further information about scheduling this PDMP reviewed, no red flags.  She does need refill today, refill sent to pharmacy on file.  Will plan for follow-up in about 3 months or sooner as needed  I discussed the above assessment and treatment plan with the patient. The patient was provided an opportunity to ask questions and all were answered. The patient agreed with the plan and demonstrated an understanding of the instructions.  The patient was advised to call back or seek an in-person evaluation if the symptoms worsen or if the condition fails to improve as anticipated.  I provided 13 minutes of face to face and non-face-to-face time during this encounter date, time was needed to gather information, review chart, records, communicate/coordinate with staff remotely, as well as complete documentation.   ___________________________________________ Yochanan Eddleman de [place], MD, ABFM, CA[facility] [place]

## 2022-05-15 ENCOUNTER — Telehealth (HOSPITAL_BASED_OUTPATIENT_CLINIC_OR_DEPARTMENT_OTHER): Payer: Medicaid Other | Admitting: Family Medicine

## 2022-05-21 ENCOUNTER — Ambulatory Visit: Payer: Medicaid Other | Attending: Obstetrics

## 2022-05-21 ENCOUNTER — Other Ambulatory Visit: Payer: Self-pay | Admitting: *Deleted

## 2022-05-21 DIAGNOSIS — O24112 Pre-existing diabetes mellitus, type 2, in pregnancy, second trimester: Secondary | ICD-10-CM | POA: Diagnosis not present

## 2022-05-21 DIAGNOSIS — Z3481 Encounter for supervision of other normal pregnancy, first trimester: Secondary | ICD-10-CM

## 2022-05-21 DIAGNOSIS — O09292 Supervision of pregnancy with other poor reproductive or obstetric history, second trimester: Secondary | ICD-10-CM | POA: Diagnosis not present

## 2022-05-21 DIAGNOSIS — Z3A19 19 weeks gestation of pregnancy: Secondary | ICD-10-CM | POA: Insufficient documentation

## 2022-05-21 DIAGNOSIS — Z362 Encounter for other antenatal screening follow-up: Secondary | ICD-10-CM

## 2022-05-23 ENCOUNTER — Ambulatory Visit: Payer: Medicaid Other | Admitting: Clinical

## 2022-05-23 DIAGNOSIS — Z91199 Patient's noncompliance with other medical treatment and regimen due to unspecified reason: Secondary | ICD-10-CM

## 2022-06-05 ENCOUNTER — Encounter (HOSPITAL_BASED_OUTPATIENT_CLINIC_OR_DEPARTMENT_OTHER): Payer: Self-pay | Admitting: *Deleted

## 2022-06-09 ENCOUNTER — Other Ambulatory Visit (HOSPITAL_BASED_OUTPATIENT_CLINIC_OR_DEPARTMENT_OTHER): Payer: Self-pay | Admitting: Family Medicine

## 2022-06-09 DIAGNOSIS — F902 Attention-deficit hyperactivity disorder, combined type: Secondary | ICD-10-CM

## 2022-06-11 MED ORDER — AMPHETAMINE-DEXTROAMPHET ER 20 MG PO CP24: 20.0000 mg | ORAL_CAPSULE | ORAL | 0 refills | Status: DC

## 2022-06-13 ENCOUNTER — Ambulatory Visit (INDEPENDENT_AMBULATORY_CARE_PROVIDER_SITE_OTHER): Payer: Medicaid Other | Admitting: Obstetrics & Gynecology

## 2022-06-13 ENCOUNTER — Encounter (HOSPITAL_BASED_OUTPATIENT_CLINIC_OR_DEPARTMENT_OTHER): Payer: Self-pay | Admitting: Obstetrics & Gynecology

## 2022-06-13 VITALS — BP 125/89 | HR 118 | Wt 177.2 lb

## 2022-06-13 DIAGNOSIS — Z3482 Encounter for supervision of other normal pregnancy, second trimester: Secondary | ICD-10-CM

## 2022-06-13 DIAGNOSIS — Z8632 Personal history of gestational diabetes: Secondary | ICD-10-CM

## 2022-06-13 DIAGNOSIS — Z3A22 22 weeks gestation of pregnancy: Secondary | ICD-10-CM

## 2022-06-13 DIAGNOSIS — F902 Attention-deficit hyperactivity disorder, combined type: Secondary | ICD-10-CM

## 2022-06-13 HISTORY — DX: Personal history of gestational diabetes: Z86.32

## 2022-06-13 NOTE — Patient Instructions (Signed)
[place] [phone]) [facility] Owens Shark, MD; Erin Hearing, MD; Gwendlyn Deutscher, MD; Andria Frames, MD; McDiarmid, MD; Dutch Quint, [address]., [place], [place] [address] [phone] Mon-Fri 8:30-12:30, 1:30-5:00  Providers come to see babies during newborn hospitalization Only accepting infants of Mother's who are seen at [facility] or have siblings seen at   [place] Medicaid - Yes; [place], MD [address], [place], [place] [address] [phone] Mon, Tue, Thur, Fri 8:30-5:00, Wed 10:00-7:00 (closed 1-2pm daily for lunch) [facility] residents with no insurance.  [place] only with Medicaid/insurance; Tricare - no  [facility]) - [facility], MD; Owens Shark, MD; Tamera Punt, MD; Doneen Poisson, MD; Fatima Sanger, MD; Lindwood Qua, MD; Thornell Sartorius, MD; Ronnald Ramp,  MD; Wynetta Emery, MD; Jess Barters, MD; Tami Ribas, MD; Derrell Lolling, MD; Dorothyann Peng, MD; Lucious Groves, NP [address], [place], [place] [address] [phone] Mon, Tue, Thur, Fri 8:30-5:30, Wed 9:30-5:30, Sat 8:30-12:30 Only accepting infants of first-time parents or siblings of current patients Hospital discharge coordinator will make follow-up appointment Medicaid - yes; Tricare - yes  [place] [phone]) [facility] Garnette Czech, MD; Rosana Hoes, MD; Servando Salina, MD; Rose Fillers, MD; Corinna Capra, MD; [facility], MD; Javier Glazier, MD; Janann Colonel, MD; Jimmye Norman, Rembert [place], [place], [place] [address] [phone] Mon-Fri 8:30-5:00, closed for lunch 12:30-1:30; Sat-Sun 10:00-1:00 Accepting Newborns with commercial insurance only, must call prior to delivery to be accepted into  practice.  Medicaid - no, Tricare - yes   [place], [place] [address] [phone] or [phone] Mon to Fri 8am to 10pm, Sat 8am to 1pm  (virtual only on weekends) Only accepts Medicaid Healthy Blue pts  [facility] ([facility]) - Pediatrics at Rogelia Boga, MD; Vilma Prader, MD; Vanita Panda, MD; Roxanne Mins, NP; Nestor Lewandowsky, MD; Ronney Lion, MD [address] [place], [place] [address] [phone] Mon-Fri 8:30-5:30 Medicaid - yes, Tricare - yes  [place] [phone]) [facility] Burnadette Pop, MD [address] [place] [address], [place], [place] [address] [phone] Sammuel Cooper, Wed Fri 8:30-5:00, Sat 8:30-12:00, Closed Thursdays Accepting siblings of established patients and first time mom's if you call prenatally Medicaid- yes; Tricare - yes  [place] at Arlina [place]es, [place]; Mannie Stabile, MD; Jerald Kief; [place], [place]; Nancy Fetter, MD; Moreen Fowler, MD;  [address], [place], [place] [address] [phone] Mon-Fri 8:30-5:00, closed for lunch 1-2 Only accepting newborns of established patients Medicaid- no; Tricare - yes  [facility] [phone]) [place] at Leane Para, MD; [place], [place], [place] [address] [phone] Mon-Fri 8:00-5:00 Medicaid - No; Tricare - Yes  [place] at [facility], [place]; [place], [place], [place], [place] [address] [phone] Mon-Fri 8:00-5:00 Medicaid - No, Tricare - Yes  [facility] Abner Greenspan, MD; Sheran Lawless, MD; [place], [place] [address], [place] [address] [place], [place] [address] [phone]  Mon-Fri 8:00-5:00 Medicaid - No; Tricare - Yes  [place]., [place], [place] [address] [phone] Mon-Fri 8:30-5:00 (lunch 12:00-1:00) Medicaid -Yes; [place] at [place], MD [place], [place], [place] [address] [phone] Mon-Fri 8:00-5:00 Seeing newborns of current patients only. No new patients Medicaid - No, Tricare - yes  Therapist, music at [place], [place]  [address]., [place],  [address] [phone] Mon-Fri 8:00-5:00 Medicaid -yes as secondary coverage only;  Tricare - yes  [place], [place]; [place], [place]; Albertina Parr, MD; Frederic Jericho, MD; Ronney Lion, MD; [place], [place]; Smoot, NP; Corinna Lines, MD; [place], [place]., [place], [place] [address] [phone] Mon-Fri 8:30-5:00, Sat 9:00-11:00 Accepts commercial insurance ONLY. Offers free prenatal information sessions for families. Medicaid - No, Tricare - Call first  [place], MD; [place], [place]; [place], [place]; [place], [place]., [place] [address] [phone] Mon-Fri 7:30-5:30 Medicaid - Yes; Marchelle Gearing yes  [place] [phone] & [phone])  [facility], [place]., [place], [place] [address] [phone] Mon-Thur 8:00-6:00, closed for lunch 12-2, closed Fridays Medicaid - yes; Tricare - no  [place], NP; Melford Aase, MD; [place], [place]; [place], [place] [address]., [place], [place], [place] [address] [phone] Mon-Fri 7:30-4:30 Medicaid - yes, Tricare - yes  [facility]  Carolynn Sayers, MD; Cristino Martes, NP; Gertie Baron, MD; Debara Pickett, NP [place] [address], [place], [place] [address] [phone] Mon-Fri 8:30-5:00, closed for lunch 1-2, Sat 8:30-12:00 - sick visits only Providers come to see babies at [facility] Only accepting newborns of siblings and first time parents ONLY if who have met with office prior to delivery Medicaid -Yes; Tricare - yes  [place], [place]; Fredderick Severance, NP; Juleen China, MD; Clydene Laming, MD:  [place] [address], [place], [place] [address] [phone] Mon- Fri 8:00-5:00, Sat 9:00-12:00 - sick visits only Accepting siblings of established patients and first time mom/baby Medicaid - Yes; Tricare - yes Patients must have vaccinations (baby vaccines)  [place] [phone] &  [phone])  [place] at [place]., [place], [place] [address] [phone] Mon-Fri 8:00-5:00 Medicaid - no; Tricare - yes  [place], MD; [place], [place]; [place], [place], [place], [place] [address] [phone] Mon-Fri 8:00-5:00 Medicaid- yes; Tricare - yes  Arial, MD; Ronnald Ramp, NP; [place], [place] [address] [place], [place], [place] [address] [phone] Mon-Fri 8:00-5:00 Medicaid - Yes; Tricare - yes  [address] [place] [phone])  [place], [place]; [place], [place]; Maisie Fus, MD; Charlesetta Garibaldi, MD; [place], NP; Isenhour, DO; [place], [place]; Jeannine Kitten, MD; Sheila Oats, MD; Hardin Negus, MD; [place], [place]; [place], [place]; [place], [place] [address] [place], [place], [place] [address] [phone] Mon-Fri 8:30-5:00, Sat 9:00-12:00 - sick only Please register online [email or web address] then schedule online or call office Medicaid-Yes; Tricare -yes  [facility] - Premier  [place]ab[place], MD; Delora Fuel, MD; [place], MD; [place], NP; [place], [place]; Everette Rank, MD; Radford Pax, NP; Melina Modena, MD [address] Stur[place], [place], [place] [address] [phone] Mon-Fri 8:00-5:30, Sat&Sun by appointment (phones open at 8:30) Medicaid - Yes; Tricare - yes  [place] [phone] & [phone]) Arona, CPNP; Nitro, MD; Araceli Bouche, MD; Jerelene Redden, NP; [place], DO [address], [place], [place] [address] [phone] M-F 8:00 - 5:15, Sat/Sun 9-12 sick visits only Medicaid - No; Tricare - yes  [place], PA-C; [place], PA-C; [place], DO; [place], PA-C; [place], PA-C; Vassie Moselle, [place]., [place], [place] [address] [phone] Mon-Thur 8:00-7:00, Fri 8:00-5:00 Accepting Medicaid for 13 and under only   [facility]  at [place] [facility] [place]) [place], [place]; List, FNP; Selinda Eon, MD; [place], PA-C; Scholer,  MD; Modena Nunnery, FNP; Everlean Patterson, FNP; Tempie Donning, MD; Selinda Eon, [place] [address] [place], [place] [address] [phone] Mon-Fri 8:30-5:30 Medicaid - Yes; Tricare - yes  [place], [place]; Rolla Plate, MD; Carola Rhine, MD; Tyron Russell, MD; [place], NP [address], [address], [place], [place] [address] [phone] Mon-Thur 8:00-5:30, Fri 8:00-5:00, Sat 9:00-12:00 Medicaid - yes, Tricare - yes  [place] [phone])  [place] at [facility], [place]; Olen Pel, MD; [place], Edison Cutler, [place], [place] [address] [phone] Mon-Fri 8:00-5:00, closed for lunch 12-1 Medicaid - No; [place] - yes  Therapist, music at [facility], [place], [place], [place] [address] [phone] Mon-Fri 8:00-5:00 Medicaid - No; Tricare - yes  [facility], MD; Joelene Millin, MD; Teryl Lucy, MD; Ronnald Ramp, MD [place], [place], [place] [address] [phone] Mon-Fri 8:00-5:00 Medicaid- Yes; Tricare - yes  [place] [phone])  [place] at [facility], [place]; Richardson, MD [address] [place] [address] [place], [place], [place] [address] [phone] Mon-Fri 8:00-5:00 Medicaid - No; [place] - yes  [place] [address] [place], [place], [place] [address] [phone] Mon-Weds 8:00-6:00, Thurs-Fri 8:00-5:00, Sat 9:00-12:00 Medicaid - yes; Tricare - yes   [place], MD; [place], [place] [address] [place], [place] [address] [phone] Mon-Fri 8:00-5:00 Medicaid - yes; St. 's - yes  [facility] Pediatric Providers  [facility] [address], [place], [place] [address] [phone] Gentry Roch: 8am -8pm, Tues, Weds: 8am - 5pm; Fri: 8-1 Medicaid - Yes; Tricare -  yes  [facility] Ilda Mori, MD; Wynetta Emery, MD; Rock Nephew, MD; [place], [place]; [place], [place] [address], [place], [place] [address] [phone] M-F 8:30 - 5:00 Medicaid - Call office; Tricare -yes  [facility] Fredderick Erb, MD; Wynelle Cleveland, MD, Cherylann Banas, MD; Koren Bound, PNP; Terrial Rhodes, [address], [place], [place] [address] [phone] M-F 8:30 - 5:00, Sat/Sun 8:30 - 12:30 (sick visits) Medicaid - Call office; Tricare -yes  [facility] Bobby Rumpf, MD; Wynetta Emery, PNP; Jaynie Crumble, MD; [place], [place]; [place], NP; Orson Aloe [address] [place], [place], [place] [address] [phone] M-F 8:30 - 5:00 Medicaid - Call office; Tricare - yes  [facility] Collene Leyden, MD; Marney Doctor, MD; Melburn Hake, MD; Franki Cabot, MD; Nogo, MD [phone] S[address] [place], [place] [address] [phone] M-Thur: 8:00 - 5:00; Fri: 8:00 - 4:00 Medicaid - yes; Tricare - yes  [facility] [address] [place], [place] [address] [phone] M-F: 8:30- 5:00, closed for lunch 12:30 - 1:00 Medicaid - yes; Tricare -yes  [place] [address], [place], [place] [id number] [phone] M-F 8:00 - 5:00 Medicaid - yes; Tricare - yes  [place], DO; Velva, DO; [place], [place] [address] [place], [place] [address] [phone] M-F 8:00 - 5:00, Closed 12-1 for lunch Medicaid - Call; Tricare - yes  [place], MD [address] [place], [place] [address] [phone] M-F: 8:00-5:00, Sat: 8:00 - noon Medicaid - call; [place] -yes  [facility], FNP-C [address] [place], [place],  [address] [phone] M-W: 8:00-5:00, Thur: 8:00 - 7:00, Fri: 8:00 - noon Medicaid - yes; Tricare - yes  [place], [place], [place],  [place] [address] [phone] M-F 8:00 - 5:00, Closed for lunch 12-1 Medicaid -  yes; [place] - yes  [facility] Pediatric Providers  [facility] at [place], [place], Trilby Drummer, MD, [place], [place], [facility], [place], [place], [place] [address] [phone] M-T 8:00-5:00, Wed-Fri 7:00-6:00 Medicaid - Yes; Tricare -yes  [place] at [facility], [place]; [address] [place], [place], [place] [address] [phone] M-F 8:00 - 5:00, closed for lunch 12-1 Medicaid - Yes; Tricare - yes  [facility]  Drema Dallas, MD; Marilynn Rail, MD; Kerby Less, MD; Margaretmary Eddy, MD; Damita Dunnings, MD; Deidre Ala, MD; Arrie Eastern, MD, Leward Quan, MD; Sherren Mocha, MD; Emmit Pomfret, MD; Morton Stall, MD; Clydene Laming, MD [address], [place], [place] [address] [phone] M-F 8:00-5:00 Medicaid - yes; Tricare - yes  [facility] [place], MD (speaks [place] and [place]) [address] [place], [place] [address] [phone] M-F: 8:30 - 5:00, closed 12:30 - 1 for lunch Medicaid - Yes; [place] -yes  [facility] Pediatric Providers  [facility] Delice Lesch, MD; Lindell Noe, MD; Lavone Neri, [place], [place], [place] [address] [phone] M-Th: 8:00 - 5:30, Fri: 8:00 - 12:00 Medicaid - yes; Tricare - yes  [facility], NP; Verlee Monte, MD; Nadeen Landau, MD [place] [address], [place], [place] [address] [phone] M-F: 8:00 - 5:00 Medicaid - yes; Tricare - yes  Jenera, NP; Deon Pilling, NP; Angelica Pou, NP; Manya Silvas, MD; Jimmye Norman, MD, Crouse, NP, Glo Herring, MD; Cathleen Fears [address] [place], [place] [address] [phone] M-F: 8:30 - 5:30p Medicaid - yes; Tricare - yes Other locations available as well  [facility], MD; Redmond Pulling, MD; Marcine Matar, [place], [place], [place] [address] [phone] M-W: 8:00am - 7:00pm, Thurs: 8:00am - 8:00pm; Fri: 8:00am -  5:00pm, closed daily from 12-1 for lunch Medicaid - yes; [place] - yes  [facility] Pediatric Providers  [facility] Robb Matar, MD; Donella Stade, [place]; Carrolyn Meiers, MD; Johnnette Litter, MD; [place], [place]; Leonie Man; [place], [place]; Alcario Drought, MD;  [address] [place], [place] [address] [phone] Jerilynn Mages - Ludwig Clarks: [place], Sat 9-noon Medicaid - Yes; Tricare -yes  [facility] at Smitty Knudsen, MD; Ronnald Ramp, FNP; Sherryle Lis, MD; Teryl Lucy, [place]. Sharyl Nimrod, [phone] M-F 8:00 - 5:00 Medicaid - call; Tricare - yes  [facility]- Lollie Sails, MD; [place], [place]; Lawana Chambers, MD; Ouida Sills, MD; Red Christians, MD; Marzetta Board, MD; Emiliano Dyer; Randolm Idol, MD; Derald Macleod, MD; [place], [place], [place], [place] [address] [phone] M-F 8:00am - 5:00pm; Sat. 9:00 - 11:00 Medicaid - yes; Tricare - yes  [facility] at [facility], MD [address], [place], [place] [address] [phone] M-F 8:00 - 5:00 Medicaid - [place] Medicaid only; Tricare - yes  [facility] - Signa Kell, MD; Rosana Hoes, [place]; Gordan Payment, [place], [place], [place] [address] [phone] M-F 8:00 - 5:00 Medicaid - yes; Tricare - yes  [facility] [address] Pediatrics - Francine Graven, MD; Owens Shark, MD, [facility], MD, [place], MD; [place], MD; Tamala Julian, MD; [address] Nyoka Lint [place], [place] [address] [phone] M-F: 8-5 Medicaid - yes; [place] - yes  [place], [place]; Lockett, MD; [address], [place],  [address] [phone] M-F 8-5 Medicaid - yes; Tricare - yes  [address] [place] Joycelyn Rua, MD; Joelene Millin, MD;  [place]-Court[place], MD; Pellam-Palmer, DNP; [place], [place], [place], [place] [address] [phone] M-F 8-5 Medicaid - yes; Tricare - yes  [facility] Danelle Earthly, MD;  Vinson Moselle; Lucie Leather, MD [address] [place], [place] [id number] [phone] M-F 7am - 5 pm Medicaid - call; Tricare - yes  [place], [place]; Louanne Skye, MD; Quentin Cornwall, [place], [place], [place] [address] [phone] M-F 8-5 Medicaid - yes; Tricare - yes  [facility] Pascal Lux, MD; Suzan Slick, MD; Jimmye Norman, [place]; Rolena Infante, [place]; Thornton Papas, [place]; Robinette Haines; [facility] - FNP [address] [place], [place] [address] [phone] M-F 8-5 Medicaid- yes; Tricare - yes  [facility] - [place] Sellers, Lively and Willa Frater, MD; Dianna Rossetti, MD; Frances Maywood, MD; Nicole Kindred, MD; [place], [place]; Marijean Bravo, MD; Louanne Skye, MD; Benjamine Mola, MD [address] [place], [place] [address] [phone] M-F: 8-5, Sat: 9-4, Sun 9-12 Medicaid - yes; Tricare - yes  [place], PNP; Rosana Hoes, [place] [address] Ny[place] Lint [place], [place] [address] [phone] M-F 8 - 5, closed 12-1 for lunch Medicaid - yes; Tricare - yes  [place], MD; Enid Derry, MD; Benjamine Mola, MD; [place], [place], [place], [place] [address] [phone] M-F 8- 5:30 Medicaid - yes; Tricare - yes  [place], MD; Jeanell Sparrow, MD; Bartholome Bill, [place], [place], [place] [address] [phone] Jerilynn Mages: Sharyne Richters; Tues-Fri: 8-5; Sat: 9-12 Medicaid - yes; Tricare - yes  Signa Kell [facility] - Myrene Buddy, MD; Bjorn Loser, MD; Carlis Abbott, MD; Claudean Kinds, MD; Erik Obey, MD [address], [place], [place] [address] [phone] Jerilynn MagesMarland Kitchen Sharyne RichtersDarrin Luis: 8-5; Sat: 8:30-12:30 Medicaid - yes; Tricare - yes  Dareen Piano [place], MD; [place], [place], [place], [place] [address] [phone] Mon-Fri: 8-5 Medicaid - yes;  Tricare - yes  [facility] - [place] Beasley, CPNP; [place], [place]; Benjamine Mola, MD; Donivan Scull, MD; Noel Journey, MD; [address] [place], [place] [address] [phone] M-F: 8-5, closed 1-2 for lunch Medicaid - yes; Tricare - yes  [place], [place]; [place], [place]; Tamala Julian, MD; [place], CPNP; [place], [place]; [place], MD; Juleen China, MD [address] [place], [place], [place] [address] [phone] M-Thurs: Sharyne Richters; Fri: 8-6; Sat: 9-12; Sun 2-4 Medicaid - yes; Tricare - yes  Signa Kell [facility] Sikeston, MD; Vernard Gambles, MD; Carol Ada, FNP; Marjorie Smolder, DO; [address], [place], [place] [address] [phone] M-F: 8-5 Medicaid - yes; [place] - yes  [facility] Pediatric Providers  Elmo, MD; [place], [place], [place], [place] [address] [phone] M - Fri: 8am - 5pm, closed for lunch 12-1 Medicaid - Yes; Tricare - yes  [facility] and [place], MD; Ovid Curd, MD; Sanger, DO; Vinocur, MD;Hall, PA; Volanda Napoleon, [place]; Megan Salon, NP 317 131 [address]3 [address], [place], [place] [address] [phone] M-F 8:00 - 5:00, Sat 8:00 - 11:30 Medicaid - yes; Tricare - yes  [facility] Humphrey Rolls, MD; [place], MD, [address], MD, [place], MD, [place], MD; Seymour, NP; [place], [place];  [address] [place], [place] [address] [phone] M-F 8:10am - 5:00pm Medicaid - yes; Tricare - yes  [place]an[place]ill[place], MD; [place], NP [address] [place], [place] [address] [phone] M-F 8:00 - 5:00 Medicaid - [place] Medicaid only; Tricare - yes  [place], [place]; [place], NP [place], [place], [place] [address] [phone] M-F 8:00 - 5:00; Closed for lunch 12 - 1:00 Medicaid - yes;  Tricare - yes  [place], MD; Darius Bump, [place], [place], [place] [address] [phone] Mon 9-5; Tues/Wed 10-5; Thurs 8:30-5; Fri: 8-12:30 Medicaid - yes; Tricare - yes  [facility] Pediatric Providers  [facility]  Har[place], MD; [place], [place] [address] [place], [place] [address] [phone] phone [phone] fax M-F 7:15 - 4:30 Medicaid - yes; Tricare - yes  [facility] Anastasio Champion, MD; Lafe, DO [address], [place], [place] [address] [phone] M-Fri: 8:30 - 5:00, closed for lunch everyday noon - 1pm Medicaid - Yes; Tricare - yes  [facility] Burdine, MD; Quillian Quince, MD; Nadara Mustard, MD; Quintin Alto, MD; T[place], [place]; Antony Blackbird, [place]; [place], [place]; Crouch [place], [place]; [place], [place] [address]. [place], [place] [address] [phone] M-Thurs: 7:30am - 7:00pm; Friday 7:30am - 4pm; Sat: 8:00 - 1:00 Medicaid - Yes; Tricare - yes  [facility] Freida Busman, MD; Lanny Cramp, MD; Janit Bern, MD; [place], [place]. [address], [place], [place], [place] [address] [phone] M-Thur: 8:00 - 5:00, Fri: 8:00 - Noon Medicaid - yes; Tricare - yes No [place] Bureau Dettinger, MD; Lajuana Ripple, DO; [place], NP; Hassell Done, NP; Lilia Pro, NP; Jeneen Montgomery, NP; Thayer Ohm, NP; Livia Snellen, MD; [place], [place] [address], [place], [place] [address] [phone] M-F 8:00 - 5:00 Medicaid - yes; Tricare - yes  [facility] Collins, FNP-C; [place], FNP-C [address] [place] Mindi [place],  [address] [phone] M, W, R 8:00-5:00, Tues: 8:00am - 7:00pm; Fri 8:00 - noon Medicaid - Yes; Tricare - yes  [facility], MD [place],  [address] [phone]  M-Thurs 8:30-5:30, Fri: 8:30-12:30pm Medicaid - Yes; Tricare - N

## 2022-06-13 NOTE — Progress Notes (Signed)
   PRENATAL VISIT NOTE  Subjective:  Hayley Lawson is a 27 y.o. G4P3003 at 87w2dbeing seen today for ongoing prenatal care.  She is currently monitored for the following issues for this low-risk pregnancy and has Migraines; ADHD; Supervision of normal pregnancy; and History of gestational diabetes on their problem list.  Patient reports she decided to restart her adderall.  Is having issues with concnetration and completion of work/tasks.  Reviewed recommendations.  She does have f/u with MFM next week.  Encouraged pt to review there as well.  Will likely need additional growth scans in pregnancy.  Contractions: Not present. Vag. Bleeding: None.  Movement: Present. Denies leaking of fluid.   The following portions of the patient's history were reviewed and updated as appropriate: allergies, current medications, past family history, past medical history, past social history, past surgical history and problem list.   Objective:   Vitals:   06/13/22 1531  BP: 125/89  Pulse: (!) 118  Weight: 177 lb 3.2 oz (80.4 kg)    Fetal Status: Fetal Heart Rate (bpm): 155 Fundal Height: 22 cm Movement: Present     General:  Alert, oriented and cooperative. Patient is in no acute distress.  Skin: Skin is warm and dry. No rash noted.   Cardiovascular: Normal heart rate noted  Respiratory: Normal respiratory effort, no problems with respiration noted  Abdomen: Soft, gravid, appropriate for gestational age.  Pain/Pressure: Absent     Pelvic: Cervical exam deferred        Extremities: Normal range of motion.  Edema: None  Mental Status: Normal mood and affect. Normal behavior. Normal judgment and thought content.   Assessment and Plan:  Pregnancy: G4P3003 at 234w2d. Encounter for supervision of other normal pregnancy in second trimester - on PNV.  Pt not taking baby asa as this time.  Highly encouraged to restart. - follow up 1 month.  2. History of gestational diabetes - will have GTT and third  trimester lab work next month.  Will plan tdap as well.  3. Attention deficit hyperactivity disorder (ADHD), combined type - has restarted her Adderall.  Advised to communicate this to MFM as well.  4. [redacted] weeks gestation of pregnancy   Preterm labor symptoms and general obstetric precautions including but not limited to vaginal bleeding, contractions, leaking of fluid and fetal movement were reviewed in detail with the patient. Please refer to After Visit Summary for other counseling recommendations.   No follow-ups on file.  Future Appointments  Date Time Provider [place]3/14/2024 11:15 AM MiMegan SalonMD [facility]  07/23/2022  1:10 PM de Cu[place]Raymond J, MD [facility]  08/06/2022  4:15 PM MiMegan SalonMD [facility]  08/20/2022  4:15 PM MiMegan SalonMD [facility]  09/05/2022  4:15 PM MiMegan SalonMD [facility]  09/17/2022  4:15 PM MiMegan SalonMD [facility]  09/25/2022  4:15 PM MiMegan SalonMD [facility]  10/01/2022  3:55 PM MiMegan SalonMD [facility]  10/08/2022  3:35 PM MiMegan SalonMD [facility]  10/15/2022  3:55 PM MiMegan SalonMD [facility]    MaMegan SalonMD

## 2022-06-18 ENCOUNTER — Ambulatory Visit: Payer: Medicaid Other | Admitting: *Deleted

## 2022-06-18 ENCOUNTER — Ambulatory Visit: Payer: Medicaid Other | Attending: Obstetrics

## 2022-06-18 ENCOUNTER — Encounter: Payer: Self-pay | Admitting: *Deleted

## 2022-06-18 DIAGNOSIS — O09292 Supervision of pregnancy with other poor reproductive or obstetric history, second trimester: Secondary | ICD-10-CM | POA: Diagnosis not present

## 2022-06-18 DIAGNOSIS — Z3A23 23 weeks gestation of pregnancy: Secondary | ICD-10-CM

## 2022-06-18 DIAGNOSIS — Z362 Encounter for other antenatal screening follow-up: Secondary | ICD-10-CM | POA: Insufficient documentation

## 2022-06-18 DIAGNOSIS — Z8632 Personal history of gestational diabetes: Secondary | ICD-10-CM | POA: Diagnosis not present

## 2022-06-19 ENCOUNTER — Encounter (HOSPITAL_BASED_OUTPATIENT_CLINIC_OR_DEPARTMENT_OTHER): Payer: Self-pay | Admitting: Obstetrics & Gynecology

## 2022-07-06 ENCOUNTER — Other Ambulatory Visit (HOSPITAL_BASED_OUTPATIENT_CLINIC_OR_DEPARTMENT_OTHER): Payer: Self-pay | Admitting: Family Medicine

## 2022-07-06 DIAGNOSIS — F902 Attention-deficit hyperactivity disorder, combined type: Secondary | ICD-10-CM

## 2022-07-09 MED ORDER — AMPHETAMINE-DEXTROAMPHET ER 20 MG PO CP24: 20.0000 mg | ORAL_CAPSULE | ORAL | 0 refills | Status: DC

## 2022-07-12 ENCOUNTER — Encounter (HOSPITAL_BASED_OUTPATIENT_CLINIC_OR_DEPARTMENT_OTHER): Payer: Self-pay | Admitting: Obstetrics & Gynecology

## 2022-07-12 ENCOUNTER — Ambulatory Visit (INDEPENDENT_AMBULATORY_CARE_PROVIDER_SITE_OTHER): Payer: Medicaid Other | Admitting: Obstetrics & Gynecology

## 2022-07-12 VITALS — BP 112/85 | HR 95 | Wt 182.4 lb

## 2022-07-12 DIAGNOSIS — F419 Anxiety disorder, unspecified: Secondary | ICD-10-CM

## 2022-07-12 DIAGNOSIS — Z3483 Encounter for supervision of other normal pregnancy, third trimester: Secondary | ICD-10-CM | POA: Diagnosis not present

## 2022-07-12 DIAGNOSIS — F902 Attention-deficit hyperactivity disorder, combined type: Secondary | ICD-10-CM

## 2022-07-12 DIAGNOSIS — Z8632 Personal history of gestational diabetes: Secondary | ICD-10-CM

## 2022-07-12 DIAGNOSIS — Z23 Encounter for immunization: Secondary | ICD-10-CM | POA: Diagnosis not present

## 2022-07-12 MED ORDER — HYDROXYZINE HCL 10 MG PO TABS: 10.0000 mg | ORAL_TABLET | Freq: Three times a day (TID) | ORAL | 0 refills | Status: DC | PRN

## 2022-07-12 NOTE — Progress Notes (Signed)
   PRENATAL VISIT NOTE  Subjective:  Hayley Lawson is a 27 y.o. G4P3003 at 102w4d being seen today for ongoing prenatal care.  She is currently monitored for the following issues for this low-risk pregnancy and has Migraines; ADHD; Supervision of normal pregnancy; History of gestational diabetes; and GDM (gestational diabetes mellitus) on their problem list.  Patient reports no complaints.  Contractions: Not present. Vag. Bleeding: None.  Movement: Present. Denies leaking of fluid.   The following portions of the patient's history were reviewed and updated as appropriate: allergies, current medications, past family history, past medical history, past social history, past surgical history and problem list.   Objective:   Vitals:   07/12/22 0950  BP: 112/85  Pulse: 95  Weight: 182 lb 6.4 oz (82.7 kg)    Fetal Status: Fetal Heart Rate (bpm): 152 Fundal Height: 28 cm Movement: Present     General:  Alert, oriented and cooperative. Patient is in no acute distress.  Skin: Skin is warm and dry. No rash noted.   Cardiovascular: Normal heart rate noted  Respiratory: Normal respiratory effort, no problems with respiration noted  Abdomen: Soft, gravid, appropriate for gestational age.  Pain/Pressure: Absent     Pelvic: Cervical exam deferred        Extremities: Normal range of motion.  Edema: None  Mental Status: Normal mood and affect. Normal behavior. Normal judgment and thought content.   Assessment and Plan:  Pregnancy: G4P3003 at [redacted]w[redacted]d 1. Encounter for supervision of other normal pregnancy in third trimester - on PNV - she is not taking her baby aspirin as advised.  Did not recommend at this point as will not decrease risks for hypertensive issues at this point. - Glucose Tolerance, 2 Hours w/1 Hour - CBC - RPR - HIV Antibody (routine testing w rflx)  2. Attention deficit hyperactivity disorder (ADHD), combined type - pt restarted her Adderall.  Dr. Burnard Bunting has written this for her.  Not typically recommended in pregnancy.  Will check with MFM about growth scans.  3. Anxiety - hydrOXYzine (ATARAX) 10 MG tablet; Take 1 tablet (10 mg total) by mouth every 8 (eight) hours as needed for anxiety.  Dispense: 90 tablet; Refill: 0 - has been referred for counseling.  Missed last appt.  Has number for calling to reschedule.  States she willl.  4. History of gestational diabetes   Preterm labor symptoms and general obstetric precautions including but not limited to vaginal bleeding, contractions, leaking of fluid and fetal movement were reviewed in detail with the patient. Please refer to After Visit Summary for other counseling recommendations.   Return in about 4 weeks (around 08/09/2022).  Future Appointments  Date Time Provider [place]  07/23/2022  1:10 PM [place], Raymond J, MD [facility]  08/14/2022  4:15 PM Leftwich-Kirby, Kathie Dike, CNM [facility]  08/28/2022  4:15 PM Leftwich-Kirby, Kathie Dike, CNM [facility]  09/13/2022  3:35 PM Megan Salon, MD [facility]  09/25/2022  4:15 PM Leftwich-Kirby, Kathie Dike, CNM [facility]  10/01/2022  3:55 PM Megan Salon, MD [facility]  10/08/2022  3:35 PM Megan Salon, MD [facility]  10/15/2022  3:55 PM Megan Salon, MD [facility]    Megan Salon, MD    MFM  Lattie Haw

## 2022-07-12 NOTE — Patient Instructions (Signed)
[place] [facility] Providers  [place] ([address]) [facility] Brown, MD; Chambliss, MD; Eniola, MD; Hensel, MD; McDiarmid, MD; McIntyer, MD [address] [place], [place] [address] [phone] Mon-Fri 8:30-12:30, 1:30-5:00  Providers come to see babies during newborn hospitalization Only accepting infants of Mother's who are seen at Family Medicine Center or have siblings seen at   [facility] Center Medicaid - Yes; Tricare - Yes   [facility] [place], MD [address] [place], [place] [address] [phone] Mon, Tue, Thur, Fri 8:30-5:00, Wed 10:00-7:00 (closed 1-2pm daily for lunch) Takes [place] residents with no insurance.  [place] only with Medicaid/insurance; Tricare - no  [facility] Ben-Davies, MD; Brown, MD; Chandler, MD; Ettefagh, MD; Grant, MD; Hanvey, MD; Herrin, MD; Jones,  MD; Lester, MD; McCormick, MD; McQueen, MD; Simha, MD; Stanley, MD; Stryffeler, NP [address], [place], [place] [address] [phone] Mon, Tue, Thur, Fri 8:30-5:30, Wed 9:30-5:30, Sat 8:30-12:30 Only accepting infants of first-time parents or siblings of current patients Hospital discharge coordinator will make follow-up appointment Medicaid - yes; Tricare - yes  [place] ([address]) [facility] Cox, MD; Davis, MD; Dovico, MD; Ettefaugh, MD; Lowe, MD; Nation, MD; Slimp, MD; Sumner, MD; Williams, MD [address], [place], [place] [address] [phone] Mon-Fri 8:30-5:00, closed for lunch 12:30-1:30; Sat-Sun 10:00-1:00 Accepting Newborns with commercial insurance only, must call prior to delivery to be accepted into  practice.  Medicaid - no, Tricare - yes   [facility] [address] [place], [place], [place] [address] [phone] or [phone] Mon to Fri 8am to 10pm, Sat 8am to 1pm  (virtual only on weekends) Only accepts Medicaid Healthy Blue pts  [facility]  Artis, MD; Coccaro, MD; Lockett Gardner, MD; Netherton, NP; Roper, MD; Wilmot, PA-C; Skinner, MD [address] [place], [place] [address] [phone] Mon-Fri 8:30-5:30 Medicaid - yes, Tricare - yes  [place] ([address]) [facility] Warner, MD [address], [place], [place] [address] [phone] Mon, Tues, Wed Fri 8:30-5:00, Sat 8:30-12:00, Closed Thursdays Accepting siblings of established patients and first time mom's if you call prenatally Medicaid- yes; Tricare - yes  [facility] Becker, PA; Hagler, MD; Quinn, PA-C; Scifres, PA; Sun, MD; Swayne, MD;  [address], [place], [place] [address] [phone] Mon-Fri 8:30-5:00, closed for lunch 1-2 Only accepting newborns of established patients Medicaid- no; Tricare - yes  [place] ([address]) [facility] Timberlake, MD; [address], [place], [place] [address] [phone] Mon-Fri 8:00-5:00 Medicaid - No; Tricare - Yes  [facility] [employer]  Brake, NP; Wharton, PA [address], [place], [place] [address] [phone] Mon-Fri 8:00-5:00 Medicaid - No, Tricare - Yes  [facility] Gay, MD; Quinlan, MD; Blatt, DNP [address] [place], [place] [address] [phone]  Mon-Fri 8:00-5:00 Medicaid - No; Tricare - Yes  [facility] [address] [place], [place] [address] [phone] Mon-Fri 8:30-5:00 (lunch 12:00-1:00) Medicaid -Yes; Tricare - Yes  [facility] Jordan, MD [address], [place], [place] [address] [phone] Mon-Fri 8:00-5:00 Seeing newborns of current patients only. No new patients Medicaid - No, Tricare - yes  [facility] Parker, MD [address] , [place] [address] [phone] Mon-Fri 8:00-5:00 Medicaid -yes as secondary coverage only;   Tricare - yes  [facility] Brecken, PA; Christy, NP; Dees, MD; DeClaire, MD; DeWeese, MD; Hodge, PA; Smoot, NP; Summer, MD; Vapne, MD [address] [place], [place] [address] [phone] Mon-Fri 8:30-5:00, Sat 9:00-11:00 Accepts commercial insurance ONLY. Offers free prenatal information sessions for families. Medicaid - No, Tricare - Call first  [facility] Bouska, MD; Gordon, PA; Jeffery, PA; Weber, PA [address] [place] [address] [phone] Mon-Fri 7:30-5:30 Medicaid - Yes; Tricare - yes  [place] ([address] & [address])  [facility] Reese, MD [address] [place], [place] [address] [phone] Mon-Thur 8:00-6:00, closed for lunch 12-2, closed Fridays Medicaid - yes; Tricare - no  [facility] Anderson, NP; Badger, MD; Beal, PA; Spencer, PA [address], [place], [place] [address] [phone] Mon-Fri 7:30-4:30 Medicaid - yes, Tricare - yes  [facility]  Agbuya, MD; Klett, NP; Romgoolam, MD; Rothstein, NP [address], [place], [place] [address] [phone] Mon-Fri 8:30-5:00, closed for lunch 1-2, Sat 8:30-12:00 - sick visits only Providers come to see babies at [facility] Only accepting newborns of siblings and first time parents ONLY if who have met with office prior to delivery Medicaid -Yes; Tricare - yes  [facility]  Golden, DO; Friddle, NP; Wallace, MD; Wood, MD:  [address], [place], [place] [address] [phone] Mon- Fri 8:00-5:00, Sat 9:00-12:00 - sick visits only Accepting siblings of established patients and first time mom/baby Medicaid - Yes; Tricare - yes Patients must have vaccinations (baby vaccines)  [place] ([address]  [address])  [facility] [address] [place], [place] [address] [phone] Mon-Fri 8:00-5:00 Medicaid - no; Tricare - yes  [facility] Briscoe, MD; Schmidt, PA; Moreira, PA [address], [place], [place] [address] [phone] Mon-Fri 8:00-5:00 Medicaid- yes; Tricare - yes  [facility] Boyd, MD; Jones, NP; Osborn, PA [address], [place], [place] [address] [phone] Mon-Fri 8:00-5:00 Medicaid - Yes; Tricare - yes  [place] ([address])  [facility] Atkinson, PA; Calderon, PA; Cummings, MD; Dillard, MD; Henrish, NP; Isenhour, DO; Martin, PA; Olson, MD; Ott, MD; Phillips, MD; Valente, PA; VanDeven, PA; Yonjof, NP [address], [place], [place] [address] [phone] Mon-Fri 8:30-5:00, Sat 9:00-12:00 - sick only Please register online [email or web address] then schedule online or call office Medicaid-Yes; Tricare -yes  [facility]  Dabrusco, MD; Dial, MD; [place], MD; Fleenor, NP; Goolsby, PA; Tonuzi, MD; Turner, NP; West, MD [address], [place], [place] [address] [phone] Mon-Fri 8:00-5:30, Sat&Sun by appointment (phones open at 8:30) Medicaid - Yes; Tricare - yes  [place] ([address]) [facility] Allen, CPNP; Bates, MD; Gordon, MD; Mills, NP; Weinshilboum, DO [address], [place], [place] [address] [phone] M-F 8:00 - 5:15, Sat/Sun 9-12 sick visits only Medicaid - No; Tricare - yes  [facility] [place] [facility]  Brown, PA-C; Cowen, PA-C; Dennis, DO; Fuster, PA-C; Martin, PA-C; Shelton, PA-C; Spry, MD [address] [place],  [address] [phone] Mon-Thur 8:00-7:00, Fri 8:00-5:00 Accepting Medicaid for 13 and under only   [facility] [place] [facility] [place] Hayes, FNP; List, FNP; Moran, MD; Pitonzo, PA-C; Scholer,   MD; Spangle, FNP; Nzenwa, FNP; Jasper, MD; Moran, MD [address] [place] [address] [place], [place] [address] [phone] Mon-Fri 8:30-5:30 Medicaid - Yes; Tricare - yes  [facility] [address]  Kelly, CPNP; [place], MD; Poth, MD; Ramadoss, MD; Staton, NP [address], [place], [place] [address] [phone] Mon-Thur 8:00-5:30, Fri 8:00-5:00, Sat 9:00-12:00 Medicaid - yes, Tricare - yes  [place] ([address])  [facility] [place] Masneri, DO; Meyers, MD; Nelson, PA [address], [place], [place] [address] [phone] Mon-Fri 8:00-5:00, closed for lunch 12-1 Medicaid - No; Tricare - yes  [facility] [place] McGowen, MD [address], [place], [place] [address] [phone] Mon-Fri 8:00-5:00 Medicaid - No; Tricare - yes  [facility] [place] MacDonald, MD; Nayak, MD; Kearns, MD; Jones, MD [address] [place] [address], [place], [place] [address] [phone] Mon-Fri 8:00-5:00 Medicaid- Yes; Tricare - yes  [place] ([address])  [facility] [place] [facility] Martin, PA-C; Tabori, MD [address], [place], [place] [address] [phone] Mon-Fri 8:00-5:00 Medicaid - No; Tricare - yes  [facility] [place]  Margin - CPNP [address], [place], [place] [address] [phone] Mon-Weds 8:00-6:00, Thurs-Fri 8:00-5:00, Sat 9:00-12:00 Medicaid - yes; Tricare - yes   [facility] [place] Aubuchon, MD; Brandon, PA [address] [place], [place] [address] [phone] Mon-Fri 8:00-5:00 Medicaid - yes; Tricare - yes  [place] [facility] Providers  [facility] [address], [place], [place] [address] [phone] M, Thur: 8am -8pm, Tues, Weds: 8am - 5pm; Fri: 8-1 Medicaid - Yes; Tricare -  yes  [facility] Mertz, MD; Johnson, MD; Wells, MD; Downs, PA; Hockenberger, PA [address], [place], [place] [address] [phone] M-F 8:30 - 5:00 Medicaid - Call office; Tricare -yes  [facility] Bonney, MD; Page, MD, Minter, MD; Mueller, PNP; Thomason, NP [address], [place], [place] [address] [phone] M-F 8:30 - 5:00, Sat/Sun 8:30 - 12:30 (sick visits) Medicaid - Call office; Tricare -yes  [facility] Lewis, MD; Shaub, PNP; Boylston, MD; Quaile, PA; Nonato, NP; Landon, CPNP [address], [place], [place] [address] [phone] M-F 8:30 - 5:00 Medicaid - Call office; Tricare - yes  [facility] [place] Cline, MD; Dvergsten, MD; Flores, MD; Kawatu, MD; Nogo, MD [address], [place], [place] [address] [phone] M-Thur: 8:00 - 5:00; Fri: 8:00 - 4:00 Medicaid - yes; Tricare - yes  [facility] [address] [place], [place], [place] [address] [phone] M-F: 8:30- 5:00, closed for lunch 12:30 - 1:00 Medicaid - yes; Tricare -yes  [facility] [place] [address], [place], [place] [address] [phone] M-F 8:00 - 5:00 Medicaid - yes; Tricare - yes  [facility] Johnson, DO; Rumball, DO; Wicker, NP [address] [place] [address], [place], [place] [address] [phone] M-F 8:00 - 5:00, Closed 12-1 for lunch Medicaid - Call; Tricare - yes  [facility] Stein, MD [address], [place], [place] [address] [phone] M-F: 8:00-5:00, Sat: 8:00 - noon Medicaid - call; Tricare -yes  [place] [facility] Providers  [facility] Collins, FNP-C [address], [place], [place] [address] [phone] M-W: 8:00-5:00, Thur: 8:00 - 7:00, Fri: 8:00 - noon Medicaid - yes; Tricare - yes  [facility] [place] Adams, FNP [address], [place],   [place] [address] [phone] M-F 8:00 - 5:00, Closed for lunch 12-1 Medicaid -  yes; Tricare - yes  [place] [facility] Providers  [facility] Smith, FNP, Melvin, MD, Fay, FNP-C [address] [place] [address] [place], [place], [place] [address] [phone] M-T 8:00-5:00, Wed-Fri 7:00-6:00 Medicaid - Yes; Tricare -yes  [facility] [place] Civiletti, DO; [address], [place], [place] [address] [phone] M-F 8:00 - 5:00, closed for lunch 12-1 Medicaid - Yes; Tricare - yes  [facility]  Barnes, MD; Bergdolt, MD; Caulfield, MD; Emrich, MD; Fiscus, MD; Hoppens, MD; Kylstra, MD, McPherson, MD; Todd, MD; Prestwood, MD; Waters, MD; Wood, MD [address], [place], [place] [address] [phone] M-F 8:00-5:00 Medicaid - yes; Tricare - yes  [facility] Cheema, MD (speaks Punjabi and Hindi) [address] [place], [place] [address] [phone] M-F: 8:30 - 5:00, closed 12:30 - 1 for lunch Medicaid - Yes; Tricare -yes  [place] [facility] Providers  [facility] Loda, MD; Timberlake, MD; Burke, MD [address], [place], [place] [address] [phone] M-Th: 8:00 - 5:30, Fri: 8:00 - 12:00 Medicaid - yes; Tricare - yes  [facility] [place] Lookabill, NP; Meier, MD; Daffron, MD [address], [place], [place] [address] [phone] M-F: 8:00 - 5:00 Medicaid - yes; Tricare - yes  [facility] Busse, NP; Bowman, NP; Baune, NP; Entwistle, MD; Williams, MD, Huffman, NP, Ferguson, MD; Patel, DO [address], [place], [place] [address] [phone] M-F: 8:30 - 5:30p Medicaid - yes; Tricare - yes Other locations available as well  [facility] [facility] Rajan, MD; Wilson, MD; Morgan, PA-C, Domenech, PA-C; Myers, PA-C [address], [place], [place] [address] [phone] M-W: 8:00am - 7:00pm, Thurs: 8:00am - 8:00pm; Fri: 8:00am -  5:00pm, closed daily from 12-1 for lunch Medicaid - yes; Tricare - yes  [place] [facility] Providers  [facility] [place] Adams, MD; Crystal, FNP; Hadley, MD; Stokes, MD; Johnson, PNP; Brady, PA-C; West, PNP; Gardner, MD;  [address], [place], [place] [address] [phone] M - Fri: 8am - 5pm, Sat 9-noon Medicaid - Yes; Tricare -yes  [facility] [place] Nayak, MD; Jones, FNP; McDonald, MD; Kearns, MD [address] [place] [address], [place], [place] [phone] M-F 8:00 - 5:00 Medicaid - call; Tricare - yes  [facility] [place] Bell, MD; Emory, PNP; Pinder, MD; Anderson, MD; Light, PA-C; Johnson, MD; Latta, MD; Saul, PNP; Rainey, MD; Clifford, MD; McClung, MD [address], [place], [place] [address] [phone] M-F 8:00am - 5:00pm; Sat. 9:00 - 11:00 Medicaid - yes; Tricare - yes  [facility] [place] Soldato-Couture, MD [address], [place], [place] [address] [phone] M-F 8:00 - 5:00 Medicaid - [place] Medicaid only; Tricare - yes  [facility] [place] Walker, MD; Davis, PNP; Ajizian, MD [address] [place] [address], [place], [place] [address] [phone] M-F 8:00 - 5:00 Medicaid - yes; Tricare - yes  [facility] [place] Barry, MD; Brown, MD, Forest, MD, Hazek, MD; Hoyle, MD; Smith, MD; [address], [place], [place] [address] [phone] M-F: 8-5 Medicaid - yes; Tricare - yes  [facility] [place] Brady, Md; Dowlen, MD; [address] [place] [address], [place], [place] [address] [phone] M-F 8-5 Medicaid - yes; Tricare - yes  [facility] Kearns, MD; Nayak, MD;   [place]-Court[place], MD; Pellam-Palmer, DNP; Herring, PNP [address], [place], [place] [address] [phone] M-F 8-5 Medicaid - yes; Tricare - yes  [facility] Weathers, MD;  Merritt, PA-C; Davis-PA-C; Warnimont, MD [address], [place], [place] [address] [phone] M-F 7am - 5 pm Medicaid - call; Tricare - yes  [facility] Hill, PNP; Erickson, MD; Robinson, MD [address], [place], [place] [address] [phone] M-F 8-5 Medicaid - yes; Tricare - yes  [facility] Kribbs, MD; Warner, MD; Williams, FNP; Brooks, FNP; Boles, FNP; Romblad, PA-C; [place] - FNP [address], [place], [place] [address] [phone] M-F 8-5 Medicaid- yes; Tricare - yes  [facility] [place] [place] [place] [facility] Yoder, MD; Verenes, MD; Armentrout, MD; Stewart, MD; Beasley, CPNP; [place], MD; Erickson, MD; Rice, MD [address], [place], [place] [address] [phone] M-F: 8-5, Sat: 9-4, Sun 9-12 Medicaid - yes; Tricare - yes  [facility] Little, PNP; Davis, PNP [address], [place], [place] [address] [phone] M-F 8 - 5, closed 12-1 for lunch Medicaid - yes; Tricare - yes  [facility] Friesen, MD; Cnegia, MD; Rice, MD; Patel, DO [address], [place], [place] [address] [phone] M-F 8- 5:30 Medicaid - yes; Tricare - yes  [facility] [place] Zvolensky, MD; Ray, MD; Haas, MD [address] [address], [place], [place] [address] [phone] M: 8-7; Tues-Fri: 8-5; Sat: 9-12 Medicaid - yes; Tricare - yes  [facility] [place] Heinrich, MD; Meyer, MD; Clark, MD; Rhyne, MD; Aubuchon, MD [address], [place], [place] [address] [phone] M: 8-7; Tues-Fri: 8-5; Sat: 8:30-12:30 Medicaid - yes; Tricare - yes  [facility] [place] Bista, MD; Dillard, PA [address], [place], [place] [address] [phone] Mon-Fri: 8-5 Medicaid - yes;  Tricare - yes  [facility] [place] Beasley, CPNP; Mahle, CPNP; Rice, MD; Duffy, MD; Culler, MD; [address], [place], [place] [address] [phone] M-F: 8-5, closed 1-2 for lunch Medicaid - yes; Tricare - yes  [facility] Rickman, PA; Mounce, NP; Smith, MD; Jordan, CPNP; Darty, PA; Ball, MD; Wallace, MD [address] [place] [address], , [place] [address] [phone] M-Thurs: 8-7; Fri: 8-6; Sat: 9-12; Sun 2-4 Medicaid - yes; Tricare - yes  [facility] Brown, MD; Shin, MD; Goodman, DNP, FNP; Sebesta, DO; [address], [place], [place] [address] [phone] M-F: 8-5 Medicaid - yes; Tricare - yes  [place] [facility] Providers  [facility] Dough, MD; Welsh, NP [address], [place], [place] [address] [phone] M - Fri: 8am - 5pm, closed for lunch 12-1 Medicaid - Yes; Tricare - yes  [facility] Manandhar, MD; Riley, MD; Sanger, DO; Vinocur, MD;Hall, PA; Walsh, PA; Campbell, NP [address], [place] [address] [phone] M-F 8:00 - 5:00, Sat 8:00 - 11:30 Medicaid - yes; Tricare - yes  [facility] Khan, MD; Redding, MD, Street, MD, Holt, MD, Burgart, MD; Rhyne, NP; Dickinson, PA;  [address] [place] [address], [place],  [address] [phone] M-F 8:10am - 5:00pm Medicaid - yes; Tricare - yes    [facility] Connors, MD; Kime, NP [address], [place], [place] [address] [phone] M-F 8:00 - 5:00 Medicaid - [place] Medicaid only; Tricare - yes  [facility] [place] Whyte, MD; Fox, NP [address], [place], [place] [address] [phone] M-F 8:00 - 5:00; Closed for lunch 12 - 1:00 Medicaid - yes;  Tricare - yes  [facility] Penner, MD; Wilburn, FNP [address], , [place] [address] [phone] Mon 9-5; Tues/Wed 10-5; Thurs 8:30-5; Fri: 8-12:30 Medicaid - yes; Tricare - yes  [place] [facility] Providers  [facility]  Golding, MD; Jackson, PA-C [address], [place], [place] [address] [phone] phone [phone] fax M-F 7:15 - 4:30 Medicaid - yes; Tricare - yes  [facility] Gosrani, MD; Meccariello, DO [address] [place], [place] [address] [phone] M-Fri: 8:30 - 5:00, closed for lunch everyday noon - 1pm Medicaid - Yes; Tricare - yes  [facility] Burdine, MD; Daniel, MD; Howard, MD; Sasser, MD; Boles, PA; Boyd, PA-C; Carroll, PA; McGee, PA; Skillman, PA; Wilson, PA [address] [place], [place] [address] [phone] M-Thurs: 7:30am - 7:00pm; Friday 7:30am - 4pm; Sat: 8:00 - 1:00 Medicaid - Yes; Tricare - yes  [facility] [place] Akhbari, MD; Law, MD; Qayumi, MD; Salvador, DO [address] [place], [place], [place] [address] [phone] M-Thur: 8:00 - 5:00, Fri: 8:00 - Noon Medicaid - yes; Tricare - yes No [place] [employer]  [facility] Dettinger, MD; Gottschalk, DO; Hawks, NP; Martin, NP; Morgan, NP; Milian, NP; Rakes, NP; Stacks, MD; Webster, PA [address], [place], [place] [address] [phone] M-F 8:00 - 5:00 Medicaid - yes; Tricare - yes  [facility] Collins, FNP-C; Bucio, FNP-C [address], [place], [place] [address] [phone] M, W, R 8:00-5:00, Tues: 8:00am - 7:00pm; Fri 8:00 - noon Medicaid - Yes; Tricare - yes  [facility] Khan, MD [address] [place] [address] [place], [place] [address] [phone]  M-Thurs 8:30-5:30, Fri: 8:30-12:30pm Medicaid - Yes; Tricare - N  

## 2022-07-13 ENCOUNTER — Encounter (HOSPITAL_BASED_OUTPATIENT_CLINIC_OR_DEPARTMENT_OTHER): Payer: Self-pay | Admitting: Obstetrics & Gynecology

## 2022-07-13 DIAGNOSIS — O24419 Gestational diabetes mellitus in pregnancy, unspecified control: Secondary | ICD-10-CM | POA: Insufficient documentation

## 2022-07-13 HISTORY — DX: Gestational diabetes mellitus in pregnancy, unspecified control: O24.419

## 2022-07-13 LAB — CBC
Hematocrit: 37.3 % (ref 34.0–46.6)
Hemoglobin: 12.5 g/dL (ref 11.1–15.9)
MCH: 31.1 pg (ref 26.6–33.0)
MCHC: 33.5 g/dL (ref 31.5–35.7)
MCV: 93 fL (ref 79–97)
Platelets: 324 10*3/uL (ref 150–450)
RBC: 4.02 x10E6/uL (ref 3.77–5.28)
RDW: 12.6 % (ref 11.7–15.4)
WBC: 8.5 10*3/uL (ref 3.4–10.8)

## 2022-07-13 LAB — GLUCOSE TOLERANCE, 2 HOURS W/ 1HR
Glucose, 1 hour: 174 mg/dL (ref 70–179)
Glucose, 2 hour: 120 mg/dL (ref 70–152)
Glucose, Fasting: 95 mg/dL — ABNORMAL HIGH (ref 70–91)

## 2022-07-13 LAB — RPR: RPR Ser Ql: NONREACTIVE

## 2022-07-13 LAB — HIV ANTIBODY (ROUTINE TESTING W REFLEX): HIV Screen 4th Generation wRfx: NONREACTIVE

## 2022-07-16 ENCOUNTER — Encounter (HOSPITAL_BASED_OUTPATIENT_CLINIC_OR_DEPARTMENT_OTHER): Payer: Self-pay | Admitting: *Deleted

## 2022-07-16 ENCOUNTER — Other Ambulatory Visit (HOSPITAL_BASED_OUTPATIENT_CLINIC_OR_DEPARTMENT_OTHER): Payer: Self-pay | Admitting: *Deleted

## 2022-07-16 DIAGNOSIS — Z3483 Encounter for supervision of other normal pregnancy, third trimester: Secondary | ICD-10-CM

## 2022-07-16 DIAGNOSIS — O24419 Gestational diabetes mellitus in pregnancy, unspecified control: Secondary | ICD-10-CM

## 2022-07-16 MED ORDER — ACCU-CHEK GUIDE ME W/DEVICE KIT: 1.0000 | PACK | Freq: Once | 0 refills | Status: AC

## 2022-07-16 MED ORDER — ACCU-CHEK GUIDE VI STRP: ORAL_STRIP | 6 refills | Status: DC

## 2022-07-16 MED ORDER — ACCU-CHEK SOFTCLIX LANCETS MISC: 6 refills | Status: DC

## 2022-07-17 ENCOUNTER — Ambulatory Visit: Payer: Medicaid Other | Admitting: *Deleted

## 2022-07-17 ENCOUNTER — Encounter: Payer: Self-pay | Admitting: *Deleted

## 2022-07-17 ENCOUNTER — Ambulatory Visit: Payer: Medicaid Other | Attending: Obstetrics and Gynecology

## 2022-07-17 ENCOUNTER — Other Ambulatory Visit: Payer: Self-pay | Admitting: *Deleted

## 2022-07-17 DIAGNOSIS — Z3A27 27 weeks gestation of pregnancy: Secondary | ICD-10-CM | POA: Diagnosis not present

## 2022-07-17 DIAGNOSIS — Z362 Encounter for other antenatal screening follow-up: Secondary | ICD-10-CM | POA: Diagnosis not present

## 2022-07-17 DIAGNOSIS — O09293 Supervision of pregnancy with other poor reproductive or obstetric history, third trimester: Secondary | ICD-10-CM

## 2022-07-17 DIAGNOSIS — O321XX0 Maternal care for breech presentation, not applicable or unspecified: Secondary | ICD-10-CM | POA: Insufficient documentation

## 2022-07-17 DIAGNOSIS — O24410 Gestational diabetes mellitus in pregnancy, diet controlled: Secondary | ICD-10-CM

## 2022-07-17 DIAGNOSIS — O24419 Gestational diabetes mellitus in pregnancy, unspecified control: Secondary | ICD-10-CM | POA: Diagnosis not present

## 2022-07-17 DIAGNOSIS — Z3483 Encounter for supervision of other normal pregnancy, third trimester: Secondary | ICD-10-CM | POA: Insufficient documentation

## 2022-07-17 DIAGNOSIS — O09292 Supervision of pregnancy with other poor reproductive or obstetric history, second trimester: Secondary | ICD-10-CM | POA: Insufficient documentation

## 2022-07-17 DIAGNOSIS — O99342 Other mental disorders complicating pregnancy, second trimester: Secondary | ICD-10-CM

## 2022-07-17 DIAGNOSIS — F902 Attention-deficit hyperactivity disorder, combined type: Secondary | ICD-10-CM | POA: Diagnosis not present

## 2022-07-19 MED ORDER — BLOOD GLUCOSE MONITOR KIT: PACK | 0 refills | Status: DC

## 2022-07-19 NOTE — Addendum Note (Signed)
Addended by: Blenda Nicely on: 07/19/2022 02:30 PM   Modules accepted: Orders

## 2022-07-23 ENCOUNTER — Ambulatory Visit (HOSPITAL_BASED_OUTPATIENT_CLINIC_OR_DEPARTMENT_OTHER): Payer: Medicaid Other | Admitting: Family Medicine

## 2022-08-06 ENCOUNTER — Encounter (HOSPITAL_BASED_OUTPATIENT_CLINIC_OR_DEPARTMENT_OTHER): Payer: Medicaid Other | Admitting: Obstetrics & Gynecology

## 2022-08-09 ENCOUNTER — Other Ambulatory Visit (HOSPITAL_BASED_OUTPATIENT_CLINIC_OR_DEPARTMENT_OTHER): Payer: Self-pay | Admitting: Family Medicine

## 2022-08-09 ENCOUNTER — Telehealth (HOSPITAL_BASED_OUTPATIENT_CLINIC_OR_DEPARTMENT_OTHER): Payer: Self-pay

## 2022-08-09 ENCOUNTER — Telehealth: Payer: Self-pay | Admitting: Family Medicine

## 2022-08-09 DIAGNOSIS — Z3490 Encounter for supervision of normal pregnancy, unspecified, unspecified trimester: Secondary | ICD-10-CM

## 2022-08-09 DIAGNOSIS — F902 Attention-deficit hyperactivity disorder, combined type: Secondary | ICD-10-CM

## 2022-08-09 DIAGNOSIS — F909 Attention-deficit hyperactivity disorder, unspecified type: Secondary | ICD-10-CM

## 2022-08-09 MED ORDER — AMPHETAMINE-DEXTROAMPHET ER 20 MG PO CP24: 20.0000 mg | ORAL_CAPSULE | ORAL | 0 refills | Status: DC

## 2022-08-09 NOTE — Telephone Encounter (Signed)
Recommend close follow-up with fetal maternal medicine concerning Adderal use during pregnancy. It is not recommended to continue this medication during pregnancy. CMA to call patient today with this concern.

## 2022-08-09 NOTE — Telephone Encounter (Signed)
Pt was called and I left pt a vm and told her to give me a call back.   Per provider would like for me to rely this message.   Hayley Lawson, please call this patient and route it to me to make sure she is aware that being on Adderall during pregnancy is not recommended. I have filled this for her twice now and Dr. Hyacinth Meeker wants her to see fetal maternal medicine to monitor the baby. There needs to be some sort of record from primary care about this. Dr. Cheryln Manly did address it in January, but he has not seen her since. thanks !!!! Clydie Braun

## 2022-08-09 NOTE — Telephone Encounter (Signed)
I spoke with Dr. Hyacinth Meeker in regards to this patient being on Adderall while pregnant. Dr. Hyacinth Meeker said she advise patient for other options. She is aware she does not fill this medication for her Dr. De [place] does. He has a note stating it is ok for patient to continue this while pregnant. She is also being followed by MFM as well.

## 2022-08-14 ENCOUNTER — Ambulatory Visit (INDEPENDENT_AMBULATORY_CARE_PROVIDER_SITE_OTHER): Payer: Medicaid Other | Admitting: Advanced Practice Midwife

## 2022-08-14 VITALS — BP 113/87 | HR 97 | Wt 187.2 lb

## 2022-08-14 DIAGNOSIS — O26893 Other specified pregnancy related conditions, third trimester: Secondary | ICD-10-CM

## 2022-08-14 DIAGNOSIS — O24419 Gestational diabetes mellitus in pregnancy, unspecified control: Secondary | ICD-10-CM

## 2022-08-14 DIAGNOSIS — R102 Pelvic and perineal pain: Secondary | ICD-10-CM

## 2022-08-14 DIAGNOSIS — Z3A31 31 weeks gestation of pregnancy: Secondary | ICD-10-CM

## 2022-08-14 DIAGNOSIS — Z3483 Encounter for supervision of other normal pregnancy, third trimester: Secondary | ICD-10-CM

## 2022-08-14 NOTE — Progress Notes (Signed)
   PRENATAL VISIT NOTE  Subjective:  Hayley Lawson is a 27 y.o. G4P3003 at [redacted]w[redacted]d being seen today for ongoing prenatal care.  She is currently monitored for the following issues for this low-risk pregnancy and has Migraines; ADHD; Pregnancy; History of gestational diabetes; and GDM (gestational diabetes mellitus) on their problem list.  Patient reports  pelvic pain, radiates down front of right leg .  Contractions: Not present. Vag. Bleeding: None.  Movement: Present. Denies leaking of fluid.   The following portions of the patient's history were reviewed and updated as appropriate: allergies, current medications, past family history, past medical history, past social history, past surgical history and problem list.   Objective:   Vitals:   08/14/22 1644  BP: 113/87  Pulse: 97  Weight: 187 lb 3.2 oz (84.9 kg)    Fetal Status: Fetal Heart Rate (bpm): 132 Fundal Height: 31 cm Movement: Present     General:  Alert, oriented and cooperative. Patient is in no acute distress.  Skin: Skin is warm and dry. No rash noted.   Cardiovascular: Normal heart rate noted  Respiratory: Normal respiratory effort, no problems with respiration noted  Abdomen: Soft, gravid, appropriate for gestational age.  Pain/Pressure: Present     Pelvic: Cervical exam deferred        Extremities: Normal range of motion.  Edema: Trace  Mental Status: Normal mood and affect. Normal behavior. Normal judgment and thought content.   Assessment and Plan:  Pregnancy: G4P3003 at [redacted]w[redacted]d 1. Encounter for supervision of other normal pregnancy in third trimester --Anticipatory guidance about next visits/weeks of pregnancy given.   2. Gestational diabetes mellitus (GDM) in second trimester, gestational diabetes method of control unspecified --Reviewed glucose log. Fasting glucose values range from 74-124, mostly 70s-80s. Less than 20 % elevated and those related to sugary evening snack. Discussed alternatives with  more  protein, lower carbohydrates.  PP values all within range.  --Will review at next visit. Discussed importance of glucose control and changes to birth plan if diabetes not well controlled or needs medication. Pt states understanding.   3. [redacted] weeks gestation of pregnancy   4. Pelvic pain affecting pregnancy in third trimester, antepartum --Pain in pelvis, radiates down right leg, worse at night. Rest/ice/heat/warm bath/increase PO fluids/Tylenol/pregnancy support belt    - AMB referral to rehabilitation   Preterm labor symptoms and general obstetric precautions including but not limited to vaginal bleeding, contractions, leaking of fluid and fetal movement were reviewed in detail with the patient. Please refer to After Visit Summary for other counseling recommendations.   Return for As scheduled.  Future Appointments  Date Time Provider Department Center  08/21/2022  3:30 PM [facility] NURSE [facility]  08/21/2022  3:45 PM [facility] US4 [facility]  08/28/2022  4:15 PM Leftwich-Kirby, Wilmer Floor, CNM [facility]  09/13/2022  3:35 PM Jerene Bears, MD [facility]  09/18/2022  3:30 PM [facility] NURSE [facility]  09/18/2022  3:45 PM [facility] US5 [facility]  09/25/2022  4:15 PM Leftwich-Kirby, Wilmer Floor, CNM [facility]  10/01/2022  3:55 PM Jerene Bears, MD [facility]  10/08/2022  3:35 PM Jerene Bears, MD [facility]  10/15/2022  3:55 PM Jerene Bears, MD [facility]    Sharen Counter, CNM

## 2022-08-20 ENCOUNTER — Encounter (HOSPITAL_BASED_OUTPATIENT_CLINIC_OR_DEPARTMENT_OTHER): Payer: Medicaid Other | Admitting: Obstetrics & Gynecology

## 2022-08-21 ENCOUNTER — Ambulatory Visit: Payer: Medicaid Other | Attending: Maternal & Fetal Medicine

## 2022-08-21 ENCOUNTER — Ambulatory Visit: Payer: Medicaid Other

## 2022-08-21 VITALS — BP 124/76 | HR 93

## 2022-08-21 DIAGNOSIS — Z8632 Personal history of gestational diabetes: Secondary | ICD-10-CM | POA: Diagnosis present

## 2022-08-21 DIAGNOSIS — O09293 Supervision of pregnancy with other poor reproductive or obstetric history, third trimester: Secondary | ICD-10-CM | POA: Insufficient documentation

## 2022-08-21 DIAGNOSIS — Z3A32 32 weeks gestation of pregnancy: Secondary | ICD-10-CM | POA: Diagnosis not present

## 2022-08-21 DIAGNOSIS — O24410 Gestational diabetes mellitus in pregnancy, diet controlled: Secondary | ICD-10-CM

## 2022-08-23 ENCOUNTER — Ambulatory Visit: Payer: Medicaid Other | Attending: Advanced Practice Midwife | Admitting: Physical Therapy

## 2022-08-23 ENCOUNTER — Other Ambulatory Visit: Payer: Self-pay

## 2022-08-23 ENCOUNTER — Encounter: Payer: Self-pay | Admitting: Physical Therapy

## 2022-08-23 DIAGNOSIS — M5459 Other low back pain: Secondary | ICD-10-CM | POA: Diagnosis not present

## 2022-08-23 DIAGNOSIS — R278 Other lack of coordination: Secondary | ICD-10-CM | POA: Diagnosis present

## 2022-08-23 DIAGNOSIS — M6281 Muscle weakness (generalized): Secondary | ICD-10-CM | POA: Insufficient documentation

## 2022-08-23 NOTE — Therapy (Signed)
OUTPATIENT PHYSICAL THERAPY THORACOLUMBAR EVALUATION   Patient Name: Hayley Lawson MRN: [id number] DOB:Mar 30, 1996, 27 y.o., female Today's Date: 08/23/2022  END OF SESSION:  PT End of Session - 08/23/22 1057     Visit Number 1    Date for PT Re-Evaluation 10/18/22    Authorization Type [employer] - Berkley Harvey required after 27 visits    Authorization - Visit Number 1    Authorization - Number of Visits 27    PT Start Time 0930    PT Stop Time 1013    PT Time Calculation (min) 43 min    Activity Tolerance Patient tolerated treatment well    Behavior During Therapy [facility] for tasks assessed/performed             Past Medical History:  Diagnosis Date   ADHD    Gestational diabetes    Past Surgical History:  Procedure Laterality Date   CHOLECYSTECTOMY  2019   UMBILICAL HERNIA REPAIR  2001   Patient Active Problem List   Diagnosis Date Noted   GDM (gestational diabetes mellitus) 07/13/2022   History of gestational diabetes 06/13/2022   Pregnancy 03/12/2022   Migraines 09/05/2021   ADHD 09/05/2021    PCP: Raymond de Peru, MD  REFERRING PROVIDER: Hurshel Party, CNM  REFERRING DIAG: [phone] (ICD-10-CM) - Pelvic pain affecting pregnancy in third trimester, antepartum  Rationale for Evaluation and Treatment: Rehabilitation  THERAPY DIAG:  Other low back pain  Muscle weakness (generalized)  Other lack of coordination  ONSET DATE: at 5 mos pregnant  SUBJECTIVE:                                                                                                                                                                                           SUBJECTIVE STATEMENT: Pt referred to OPPT for pelvic pain.  She is currently [redacted]w[redacted]d pregnant.  This is fourth pregnancy.  All vaginal deliveries - 2015, 2017, 2018.   Pain started with this pregnancy around 5mos.  Pain is central lumbar and extends into tailbone.  Pain and pressure in pelvic floor area which is  better with wearing shapewear via shorts and abdominal support.  If I don't wear it, it feels like the baby will just fall out. Only getting 2-3 hours of sleep.     PERTINENT HISTORY:  EDD 10/15/22 Gestational diabetes Umbilical hernia repair 2001 Hx of 3 vaginal deliveries - this is 4th pregnancy  PAIN:  PAIN:  Are you having pain? Yes NPRS scale: 4/10 Pain location: central  Pain orientation: Right, Left, and Medial  PAIN TYPE: pressure and achey Pain description: intermittent  Aggravating factors: sitting too  long and the baby is hanging out on right side all the time Relieving factors: stretching Rt leg back, getting on all 4s   PRECAUTIONS: Other: pregnant  WEIGHT BEARING RESTRICTIONS: No  FALLS:  Has patient fallen in last 6 months? No  LIVING ENVIRONMENT: Lives with: lives with their family Lives in: House/apartment Stairs: Yes: Internal: 15 steps; on right going up and External: 3 steps; can reach both Has following equipment at home: None  OCCUPATION: full time computer work  PLOF: Independent  PATIENT GOALS: exercises to get some relief  NEXT MD VISIT: 1 week  OBJECTIVE:   DIAGNOSTIC FINDINGS:  N/a  PATIENT SURVEYS:  Modified Oswestry 11/50   SCREENING FOR RED FLAGS: Bowel or bladder incontinence: Yes: with movement - can feel a panty liner Spinal tumors: No Cauda equina syndrome: No Compression fracture: No Abdominal aneurysm: No  COGNITION: Overall cognitive status: Within functional limits for tasks assessed     SENSATION: WFL  MUSCLE LENGTH: Hamstrings: Right 80 deg; Left 85 deg   POSTURE: increased lumbar lordosis, decreased thoracic kyphosis, and anterior pelvic tilt  PALPATION: Tender along central lumbar spine and SI joints, soft tissues non-tender along spine and hips  LUMBAR ROM:   AROM eval  Flexion Fingers to knees, relief  Extension WFL, pain  Right lateral flexion 10, contralateral stretch  Left lateral flexion 10,  contralateral stretch  Right rotation 50%, tight  Left rotation 50%, tight   (Blank rows = not tested)  LOWER EXTREMITY ROM:    Hip ROM WNL bil - trending toward hypermobile  LOWER EXTREMITY MMT:    Bil hips 4/5, bil knees 4/5     FUNCTIONAL TESTS:  Able to balance in SLS bil without pain , functional squat WNL without pain  GAIT: Distance walked: within clinic Assistive device utilized: None Level of assistance: Complete Independence Comments: increased frontal plane motion of trunk, knee valgus Lt  TODAY'S TREATMENT:                                                                                                                              DATE:  08/23/22  Pt education: Seated and standing pelvic hula hoop technique for fully emptying bladder after initial emptying given leakage after voiding Diaphragmatic breathing in SL with TA and PF contractions on exhale Initiated HEP - Pt reported relief with selected therex    Check all possible CPT codes: 40981- Therapeutic Exercise    Check all conditions that are expected to impact treatment: Conditions expected to impact treatment:Current pregnancy or recent postpartum  If treatment provided at initial evaluation, no treatment charged due to lack of authorization.       PATIENT EDUCATION:  Education details: Access Code: [email or web address] Person educated: Patient Education method: Programmer, multimedia, Facilities manager, Verbal cues, and Handouts Education comprehension: verbalized understanding and returned demonstration  HOME EXERCISE PROGRAM: Access Code: [email or web address] URL: [email or web address] Date: 08/23/2022 Prepared by: Loistine Simas Tabitha Tupper  Exercises - Quadruped Rocking Slow  -  1 x daily - 7 x weekly - 3 sets - 10 reps - Cat Cow  - 1 x daily - 7 x weekly - 3 sets - 10 reps - Seated Lumbar Flexion Stretch  - 1 x daily - 7 x weekly - 3 sets - 10 reps - Standing 'L' Stretch at Counter  - 1 x daily - 7 x weekly - 3 sets - 10  reps - Pelvic Circles on Swiss Ball  - 1 x daily - 7 x weekly - 1 sets - 10 reps - Pelvic Tilt on Swiss Ball  - 1 x daily - 7 x weekly - 1 sets - 10 reps  ASSESSMENT:  CLINICAL IMPRESSION: Patient is a 27 y.o. female who was seen today for physical therapy evaluation and treatment for pelvic pain during 3rd trimester.  At evaluation Pt is [redacted]w[redacted]d pregnant with EDD 10/15/22.  This is patient's 4th pregnancy having had vaginal deliveries in 2015, 2017 and 2018.  She does report symptoms suggesting pelvic floor weakness such as incomplete emptying and leakage of urine, poor awareness of how to contract PF, and feeling like her "baby will fall out" if she doesn't wear her shapewear full time.   She has pain in central lumbar to tailbone and bil buttocks which is relieved with quadupred positioning.  Pain limits sleep and sitting tolerance.  She presents with decreased trunk ROM, weakness in bil LE, and weakness in core and PF.  Hip ROM is WNL.  She experienced relief following above initial HEP end of assessment today.  Pt will benefit from skilled PT to address mobility and strength limitations and provide Pt education as needed as she progresses through pregnancy toward delivery.  OBJECTIVE IMPAIRMENTS: Abnormal gait, decreased activity tolerance, decreased balance, decreased coordination, decreased endurance, decreased mobility, difficulty walking, decreased ROM, decreased strength, hypomobility, increased fascial restrictions, increased muscle spasms, impaired flexibility, improper body mechanics, postural dysfunction, and pain.   ACTIVITY LIMITATIONS: carrying, lifting, bending, sitting, standing, squatting, sleeping, transfers, bed mobility, dressing, and locomotion level  PARTICIPATION LIMITATIONS: meal prep, cleaning, laundry, driving, shopping, and community activity  PERSONAL FACTORS: 1 comorbidity: pain in pregnancy  are also affecting patient's functional outcome.   REHAB POTENTIAL:  Good  CLINICAL DECISION MAKING: Stable/uncomplicated  EVALUATION COMPLEXITY: Low   GOALS: Goals reviewed with patient? Yes  SHORT TERM GOALS: Target date: 09/20/22  Pt will be ind with initial HEP Baseline: Goal status: INITIAL  2.  Pt will be educated about positioning and other strategies to improve sleep and daily pain/pressure through 3rd trimester of pregnancy. Baseline:  Goal status: INITIAL  3.  Pt will learn how to contract deep abdominals with transitions, functional movements and bed mobility. Baseline:  Goal status: INITIAL    LONG TERM GOALS: Target date: 10/15/22  Pt will be ind with advanced HEP for positioning for pressure relief, hip and pelvic mobility, and stabilization strategies for ongoing daily tasks. Baseline:  Goal status: INITIAL  2.  Pt will be educated on labor and delivery positioning options Baseline:  Goal status: INITIAL  3.  Pt will report improved pain with daily task performance by at least 50% Baseline:  Goal status: INITIAL  4.  Pt will report improved sleep stretches of at least 6 consecutive hours with change in position as needed. Baseline:  Goal status: INITIAL  5.  Pt will improve ODI score to at most 8/50 to demo improved management of symptoms in pregnancy. Baseline: 11/50 Goal status: INITIAL  6.  Pt will be able to tolerate sitting for work with positional breaks most days of the week without exacerbation of pain above 4/10 Baseline:  Goal status: INITIAL  PLAN:  Every 2 weeks POC  PT DURATION: 8 weeks  PLANNED INTERVENTIONS: Therapeutic exercises, Therapeutic activity, Neuromuscular re-education, Gait training, Patient/Family education, Self Care, Joint mobilization, DME instructions, Aquatic Therapy, Dry Needling, Electrical stimulation, Spinal mobilization, Cryotherapy, Moist heat, Taping, and Manual therapy.  PLAN FOR NEXT SESSION: review HEP, body mechanics, gentle hip stabilization and trunk stretching,  positioning for comfort at night for improved sleep   Nuria Phebus, PT 08/23/22 1:22 PM

## 2022-08-27 ENCOUNTER — Ambulatory Visit (HOSPITAL_BASED_OUTPATIENT_CLINIC_OR_DEPARTMENT_OTHER)
Admission: RE | Admit: 2022-08-27 | Discharge: 2022-08-27 | Disposition: A | Discharge status: Home / Self Care | Payer: Medicaid Other | Source: Ambulatory Visit | Attending: Obstetrics & Gynecology | Admitting: Obstetrics & Gynecology

## 2022-08-27 ENCOUNTER — Ambulatory Visit (INDEPENDENT_AMBULATORY_CARE_PROVIDER_SITE_OTHER): Payer: Medicaid Other | Admitting: Obstetrics & Gynecology

## 2022-08-27 ENCOUNTER — Other Ambulatory Visit (HOSPITAL_BASED_OUTPATIENT_CLINIC_OR_DEPARTMENT_OTHER): Payer: Self-pay | Admitting: Obstetrics & Gynecology

## 2022-08-27 ENCOUNTER — Encounter (HOSPITAL_BASED_OUTPATIENT_CLINIC_OR_DEPARTMENT_OTHER): Payer: Self-pay | Admitting: Obstetrics & Gynecology

## 2022-08-27 ENCOUNTER — Encounter (HOSPITAL_BASED_OUTPATIENT_CLINIC_OR_DEPARTMENT_OTHER): Payer: Self-pay

## 2022-08-27 VITALS — BP 129/80 | HR 110 | Wt 189.4 lb

## 2022-08-27 DIAGNOSIS — F902 Attention-deficit hyperactivity disorder, combined type: Secondary | ICD-10-CM

## 2022-08-27 DIAGNOSIS — Z3A33 33 weeks gestation of pregnancy: Secondary | ICD-10-CM

## 2022-08-27 DIAGNOSIS — O24415 Gestational diabetes mellitus in pregnancy, controlled by oral hypoglycemic drugs: Secondary | ICD-10-CM | POA: Diagnosis not present

## 2022-08-27 DIAGNOSIS — G43909 Migraine, unspecified, not intractable, without status migrainosus: Secondary | ICD-10-CM | POA: Diagnosis not present

## 2022-08-27 DIAGNOSIS — M7989 Other specified soft tissue disorders: Secondary | ICD-10-CM

## 2022-08-27 NOTE — Progress Notes (Signed)
   PRENATAL VISIT NOTE  Subjective:  Hayley Lawson is a 27 y.o. G4P3003 at [redacted]w[redacted]d being seen today for problem visit that is out of schedule with routine ob visits.  Pt reports this morning having a purplish and raised nodule on her right lateral calf.  She took a baby aspirin and it has gone down some.  ongoing prenatal care.  She is currently monitored for the following issues for this {Blank single:19197::"high-risk","low-risk"} pregnancy and has Migraines; ADHD; Pregnancy; History of gestational diabetes; and GDM (gestational diabetes mellitus) on their problem list.  Patient reports {sx:14538}.  Contractions: Not present. Vag. Bleeding: None.  Movement: Present. Denies leaking of fluid.   The following portions of the patient's history were reviewed and updated as appropriate: allergies, current medications, past family history, past medical history, past social history, past surgical history and problem list.   Objective:   Vitals:   08/27/22 1410  BP: 129/80  Pulse: (!) 110  Weight: 189 lb 6.4 oz (85.9 kg)    Fetal Status: Fetal Heart Rate (bpm): 145   Movement: Present     General:  Alert, oriented and cooperative. Patient is in no acute distress.  Skin: Skin is warm and dry. No rash noted.   Cardiovascular: Normal heart rate noted  Respiratory: Normal respiratory effort, no problems with respiration noted  Abdomen: Soft, gravid, appropriate for gestational age.  Pain/Pressure: Absent     Pelvic: {Blank single:19197::"Cervical exam performed in the presence of a chaperone","Cervical exam deferred"}        Extremities: Normal range of motion.  Edema: Trace  Mental Status: Normal mood and affect. Normal behavior. Normal judgment and thought content.   Assessment and Plan:  Pregnancy: G4P3003 at [redacted]w[redacted]d There are no diagnoses linked to this encounter. {Blank single:19197::"Term","Preterm"} labor symptoms and general obstetric precautions including but not limited to vaginal  bleeding, contractions, leaking of fluid and fetal movement were reviewed in detail with the patient. Please refer to After Visit Summary for other counseling recommendations.   No follow-ups on file.  Future Appointments  Date Time Provider Department Center  09/05/2022 10:15 AM Donita Brooks, PT [facility] None  09/13/2022  3:35 PM Jerene Bears, MD [facility]  09/17/2022 12:30 PM Reather Laurence, PT [facility] None  09/18/2022  3:30 PM [facility] NURSE [facility]  09/18/2022  3:45 PM [facility] US5 [facility]  09/25/2022  4:15 PM Leftwich-Kirby, Wilmer Floor, CNM [facility]  09/26/2022 10:15 AM Beuhring, Doyce Para, PT [facility] None  10/01/2022  3:55 PM Jerene Bears, MD [facility]  10/08/2022  3:35 PM Jerene Bears, MD [facility]  10/09/2022 10:15 AM Beuhring, Doyce Para, PT [facility] None  10/15/2022  3:55 PM Jerene Bears, MD [facility]    Jerene Bears, MD

## 2022-08-27 NOTE — Telephone Encounter (Signed)
Informed pt that results are negative. Advised to wear compression stockings as much as possible.

## 2022-08-28 ENCOUNTER — Encounter (HOSPITAL_BASED_OUTPATIENT_CLINIC_OR_DEPARTMENT_OTHER): Payer: Medicaid Other | Admitting: Advanced Practice Midwife

## 2022-09-05 ENCOUNTER — Ambulatory Visit: Payer: Medicaid Other | Admitting: Physical Therapy

## 2022-09-05 ENCOUNTER — Encounter (HOSPITAL_BASED_OUTPATIENT_CLINIC_OR_DEPARTMENT_OTHER): Payer: Medicaid Other | Admitting: Obstetrics & Gynecology

## 2022-09-06 ENCOUNTER — Other Ambulatory Visit (HOSPITAL_BASED_OUTPATIENT_CLINIC_OR_DEPARTMENT_OTHER): Payer: Self-pay | Admitting: Family Medicine

## 2022-09-06 DIAGNOSIS — F902 Attention-deficit hyperactivity disorder, combined type: Secondary | ICD-10-CM

## 2022-09-09 ENCOUNTER — Other Ambulatory Visit (HOSPITAL_BASED_OUTPATIENT_CLINIC_OR_DEPARTMENT_OTHER): Payer: Self-pay | Admitting: Family Medicine

## 2022-09-09 ENCOUNTER — Encounter (HOSPITAL_BASED_OUTPATIENT_CLINIC_OR_DEPARTMENT_OTHER): Payer: Self-pay | Admitting: Family Medicine

## 2022-09-09 DIAGNOSIS — F902 Attention-deficit hyperactivity disorder, combined type: Secondary | ICD-10-CM

## 2022-09-10 ENCOUNTER — Encounter (HOSPITAL_BASED_OUTPATIENT_CLINIC_OR_DEPARTMENT_OTHER): Payer: Self-pay | Admitting: Family Medicine

## 2022-09-10 ENCOUNTER — Ambulatory Visit (HOSPITAL_BASED_OUTPATIENT_CLINIC_OR_DEPARTMENT_OTHER): Payer: Medicaid Other | Admitting: Family Medicine

## 2022-09-10 ENCOUNTER — Encounter (HOSPITAL_BASED_OUTPATIENT_CLINIC_OR_DEPARTMENT_OTHER): Payer: Self-pay | Admitting: Obstetrics & Gynecology

## 2022-09-10 ENCOUNTER — Encounter (HOSPITAL_BASED_OUTPATIENT_CLINIC_OR_DEPARTMENT_OTHER): Payer: Self-pay | Admitting: Advanced Practice Midwife

## 2022-09-10 VITALS — BP 129/93 | HR 115 | Ht 62.0 in | Wt 184.0 lb

## 2022-09-10 DIAGNOSIS — R6889 Other general symptoms and signs: Secondary | ICD-10-CM | POA: Diagnosis not present

## 2022-09-10 DIAGNOSIS — J069 Acute upper respiratory infection, unspecified: Secondary | ICD-10-CM

## 2022-09-10 DIAGNOSIS — F902 Attention-deficit hyperactivity disorder, combined type: Secondary | ICD-10-CM

## 2022-09-10 LAB — POC INFLUENZA A&B (BINAX/QUICKVUE)
Influenza A, POC: NEGATIVE
Influenza B, POC: NEGATIVE

## 2022-09-10 MED ORDER — AMPHETAMINE-DEXTROAMPHET ER 20 MG PO CP24: 20.0000 mg | ORAL_CAPSULE | ORAL | 0 refills | Status: DC

## 2022-09-10 NOTE — Assessment & Plan Note (Signed)
About 4 days ago, patient began to experience upper respiratory symptoms including sore throat, sinus congestion.  She has also had some nighttime sweats.  Feels that she may have had some fevers, has not checked her temperature.  She has been utilizing over-the-counter medications including antihistamine, TheraFlu.  Denies any myalgias.  She did have recent influenza about 4 to 5 months ago, feels that symptoms are somewhat similar.  She is not necessarily aware of sick contacts, however her daughter is in daycare currently. On exam, patient is in no acute distress, vital signs stable, patient is afebrile.  Cardiovascular exam with regular rate and rhythm, lungs clear to auscultation bilaterally.  Mild pharyngeal erythema, no cervical lymphadenopathy. Given history and exam, suspect viral URI.  Patient does have some concern for influenza, feel that this is less likely.  Discussed options with patient and we will proceed with flu swab in office today.  Discussed recommendations related to symptomatic management, discussed caution with certain medications related to current pregnancy.  Recommend adequate rest and hydration.  Discussed expected progression over the coming days.

## 2022-09-10 NOTE — Progress Notes (Signed)
    Procedures performed today:    None.  Independent interpretation of notes and tests performed by another provider:   None.  Brief History, Exam, Impression, and Recommendations:    BP (!) 129/93 (BP Location: Right Arm, Patient Position: Sitting, Cuff Size: Large)   Pulse (!) 115   Ht 5\' 2"  (1.575 m)   Wt 184 lb (83.5 kg)   LMP 01/08/2022   SpO2 97%   BMI 33.65 kg/m   Viral URI About 4 days ago, patient began to experience upper respiratory symptoms including sore throat, sinus congestion.  She has also had some nighttime sweats.  Feels that she may have had some fevers, has not checked her temperature.  She has been utilizing over-the-counter medications including antihistamine, TheraFlu.  Denies any myalgias.  She did have recent influenza about 4 to 5 months ago, feels that symptoms are somewhat similar.  She is not necessarily aware of sick contacts, however her daughter is in daycare currently. On exam, patient is in no acute distress, vital signs stable, patient is afebrile.  Cardiovascular exam with regular rate and rhythm, lungs clear to auscultation bilaterally.  Mild pharyngeal erythema, no cervical lymphadenopathy. Given history and exam, suspect viral URI.  Patient does have some concern for influenza, feel that this is less likely.  Discussed options with patient and we will proceed with flu swab in office today.  Discussed recommendations related to symptomatic management, discussed caution with certain medications related to current pregnancy.  Recommend adequate rest and hydration.  Discussed expected progression over the coming days.  ADHD Patient requesting refill of Adderall today.  She is currently pregnant, has discussed this with her OB/GYN and is seeing behavioral health counselor through her OB/GYN.  She was advised by her provider on having consultation with behavioral medicine specialist regarding continuing with stimulant medication versus transitioning to  alternative medication regimen.  Patient reports that she did not have this appointment and on review of chart, it does appear that behavioral medicine office had reached out to patient, but they were unable to reach patient on 3 separate attempts.  Denies any issues currently with chest pain, palpitations, appetite concerns.  No sleep related issues reported.  Feels the current dose of medication continues to be effective for her. PDMP reviewed, no red flags.  Refill sent to pharmacy. Did discuss importance of having consultation with behavioral medicine specialist as recommended by her OB/GYN due to potential risk related to stimulant use and consideration of alternative medication options.  Additionally, her OB/GYN may want to proceed with additional monitoring/follow-up if continuing with stimulant.  Patient voiced understanding and indicated that she would reach back out to her OB/GYN office to further discuss evaluation with behavioral medicine specialist  Return in about 3 months (around 12/11/2022).   ___________________________________________ Kathia Covington de Peru, MD, ABFM, CAQSM Primary Care and Sports Medicine [facility]

## 2022-09-10 NOTE — Assessment & Plan Note (Signed)
Patient requesting refill of Adderall today.  She is currently pregnant, has discussed this with her OB/GYN and is seeing behavioral health counselor through her OB/GYN.  She was advised by her provider on having consultation with behavioral medicine specialist regarding continuing with stimulant medication versus transitioning to alternative medication regimen.  Patient reports that she did not have this appointment and on review of chart, it does appear that behavioral medicine office had reached out to patient, but they were unable to reach patient on 3 separate attempts.  Denies any issues currently with chest pain, palpitations, appetite concerns.  No sleep related issues reported.  Feels the current dose of medication continues to be effective for her. PDMP reviewed, no red flags.  Refill sent to pharmacy. Did discuss importance of having consultation with behavioral medicine specialist as recommended by her OB/GYN due to potential risk related to stimulant use and consideration of alternative medication options.  Additionally, her OB/GYN may want to proceed with additional monitoring/follow-up if continuing with stimulant.  Patient voiced understanding and indicated that she would reach back out to her OB/GYN office to further discuss evaluation with behavioral medicine specialist

## 2022-09-10 NOTE — Telephone Encounter (Signed)
Called pt in response to [employer]. Pt is going to be seen by PCP to have cold symptoms evaluated and tested for Covid/Flu. Asked pt to clarify her response about decreased movement. Pt states that she has felt movement but doesn't feel that she is moving as much as she has been. Advised pt to lie down, drink some water, and to do kick counts. Advised that if she does not feel at least 10 movements in a 2 hour time frame, she will need to come in or go to [facility] for monitoring

## 2022-09-12 ENCOUNTER — Ambulatory Visit (INDEPENDENT_AMBULATORY_CARE_PROVIDER_SITE_OTHER): Payer: Self-pay | Admitting: Obstetrics & Gynecology

## 2022-09-12 VITALS — BP 124/107 | HR 98 | Wt 190.0 lb

## 2022-09-12 DIAGNOSIS — O0993 Supervision of high risk pregnancy, unspecified, third trimester: Secondary | ICD-10-CM

## 2022-09-13 ENCOUNTER — Encounter (HOSPITAL_BASED_OUTPATIENT_CLINIC_OR_DEPARTMENT_OTHER): Payer: Medicaid Other | Admitting: Obstetrics & Gynecology

## 2022-09-13 ENCOUNTER — Ambulatory Visit (INDEPENDENT_AMBULATORY_CARE_PROVIDER_SITE_OTHER): Payer: Medicaid Other | Admitting: Obstetrics & Gynecology

## 2022-09-13 VITALS — BP 129/86 | HR 97 | Wt 191.6 lb

## 2022-09-13 DIAGNOSIS — G43909 Migraine, unspecified, not intractable, without status migrainosus: Secondary | ICD-10-CM

## 2022-09-13 DIAGNOSIS — F902 Attention-deficit hyperactivity disorder, combined type: Secondary | ICD-10-CM

## 2022-09-13 DIAGNOSIS — O24415 Gestational diabetes mellitus in pregnancy, controlled by oral hypoglycemic drugs: Secondary | ICD-10-CM

## 2022-09-13 DIAGNOSIS — Z8632 Personal history of gestational diabetes: Secondary | ICD-10-CM

## 2022-09-13 DIAGNOSIS — Z3A35 35 weeks gestation of pregnancy: Secondary | ICD-10-CM

## 2022-09-13 DIAGNOSIS — O0993 Supervision of high risk pregnancy, unspecified, third trimester: Secondary | ICD-10-CM

## 2022-09-13 NOTE — Progress Notes (Signed)
   PRENATAL VISIT NOTE  Subjective:  Hayley Lawson is a 27 y.o. G4P3003 at [redacted]w[redacted]d being seen today for ongoing prenatal care.  She is currently monitored for the following issues for this high-risk pregnancy and has Migraines; ADHD; Pregnancy, supervision, high-risk, third trimester; History of gestational diabetes; GDM (gestational diabetes mellitus); and Viral URI on their problem list.  Patient reports  some pelvic pressure .  Contractions: Not present. Vag. Bleeding: None.  Movement: Present. Denies leaking of fluid.   She is checking blood sugars occasionally.  Of the ones that are written down, almost all are normal.  Only two were elevated.    The following portions of the patient's history were reviewed and updated as appropriate: allergies, current medications, past family history, past medical history, past social history, past surgical history and problem list.   Objective:   Vitals:   09/13/22 1610  BP: 129/86  Pulse: 97  Weight: 191 lb 9.6 oz (86.9 kg)    Fetal Status: Fetal Heart Rate (bpm): 125   Movement: Present     General:  Alert, oriented and cooperative. Patient is in no acute distress.  Skin: Skin is warm and dry. No rash noted.   Cardiovascular: Normal heart rate noted  Respiratory: Normal respiratory effort, no problems with respiration noted  Abdomen: Soft, gravid, appropriate for gestational age.  Pain/Pressure: Present     Pelvic: Cervical exam deferred        Extremities: Normal range of motion.  Edema: None  Mental Status: Normal mood and affect. Normal behavior. Normal judgment and thought content.   Assessment and Plan:  Pregnancy: G4P3003 at [redacted]w[redacted]d 1. [redacted] weeks gestation of pregnancy - on PNV - not taking baby aspirin as recommended.  At this point, reviewed no need to start. - recheck 1 week.  GBS and repeat GC/Chl testing reviewed.  2. Gestational diabetes mellitus (GDM) in third trimester controlled on oral hypoglycemic drug - not checking  blood sugars regularly - <1/3 abnormal for what she is checking - more regular evaluation of blood sugars recommended - growth scan scheduled again 5/21  3. History of gestational diabetes  4. Migraine without status migrainosus, not intractable, unspecified migraine type - stable  5. Attention deficit hyperactivity disorder (ADHD), combined type - pt is taking Adderall and this is being written by her PCP  Preterm labor symptoms and general obstetric precautions including but not limited to vaginal bleeding, contractions, leaking of fluid and fetal movement were reviewed in detail with the patient. Please refer to After Visit Summary for other counseling recommendations.   Return in about 1 week (around 09/20/2022).   Future Appointments  Date Time Provider Department Center  09/17/2022 12:30 PM Reather Laurence, PT [facility] None  09/18/2022  3:30 PM [facility] NURSE [facility]  09/18/2022  3:45 PM [facility] US5 [facility]  09/25/2022  4:15 PM Leftwich-Kirby, Wilmer Floor, CNM [facility]  09/26/2022 10:15 AM Beuhring, Doyce Para, PT [facility] None  10/01/2022  3:55 PM Jerene Bears, MD [facility]  10/08/2022  3:35 PM Jerene Bears, MD [facility]  10/09/2022 10:15 AM Beuhring, Doyce Para, PT [facility] None  10/15/2022  3:55 PM Jerene Bears, MD [facility]    Jerene Bears, MD

## 2022-09-14 ENCOUNTER — Encounter (HOSPITAL_BASED_OUTPATIENT_CLINIC_OR_DEPARTMENT_OTHER): Payer: Self-pay | Admitting: Obstetrics & Gynecology

## 2022-09-15 NOTE — Progress Notes (Signed)
Pt cancelled appt and rescheduled

## 2022-09-17 ENCOUNTER — Encounter (HOSPITAL_BASED_OUTPATIENT_CLINIC_OR_DEPARTMENT_OTHER): Payer: Medicaid Other | Admitting: Obstetrics & Gynecology

## 2022-09-17 ENCOUNTER — Encounter: Payer: Self-pay | Admitting: Rehabilitative and Restorative Service Providers"

## 2022-09-17 ENCOUNTER — Ambulatory Visit
Payer: Medicaid Other | Attending: Advanced Practice Midwife | Admitting: Rehabilitative and Restorative Service Providers"

## 2022-09-17 DIAGNOSIS — M6281 Muscle weakness (generalized): Secondary | ICD-10-CM | POA: Diagnosis present

## 2022-09-17 DIAGNOSIS — M5459 Other low back pain: Secondary | ICD-10-CM | POA: Insufficient documentation

## 2022-09-17 DIAGNOSIS — R278 Other lack of coordination: Secondary | ICD-10-CM | POA: Insufficient documentation

## 2022-09-17 NOTE — Therapy (Signed)
OUTPATIENT PHYSICAL THERAPY TREATMENT NOTE   Patient Name: Hayley Lawson MRN: [id number] DOB:[birth year], 27 y.o., female Today's Date: 09/17/2022  END OF SESSION:  PT End of Session - 09/17/22 1243     Visit Number 2    Date for PT Re-Evaluation 10/18/22    Authorization Type [employer] - Berkley Harvey required after 27 visits    Authorization - Visit Number 2    Authorization - Number of Visits 27    PT Start Time 1242   Pt arrived late for appointment   PT Stop Time 1310    PT Time Calculation (min) 28 min    Activity Tolerance Patient tolerated treatment well    Behavior During Therapy [place] for tasks assessed/performed             Past Medical History:  Diagnosis Date   ADHD    Gestational diabetes    Past Surgical History:  Procedure Laterality Date   CHOLECYSTECTOMY  2019   UMBILICAL HERNIA REPAIR  2001   Patient Active Problem List   Diagnosis Date Noted   Viral URI 09/10/2022   GDM (gestational diabetes mellitus) 07/13/2022   History of gestational diabetes 06/13/2022   Pregnancy, supervision, high-risk, third trimester 03/12/2022   Migraines 09/05/2021   ADHD 09/05/2021    PCP: Raymond de Peru, MD  REFERRING PROVIDER: Hurshel Party, CNM  REFERRING DIAG: [phone] (ICD-10-CM) - Pelvic pain affecting pregnancy in third trimester, antepartum  Rationale for Evaluation and Treatment: Rehabilitation  THERAPY DIAG:  Other low back pain  Muscle weakness (generalized)  Other lack of coordination  ONSET DATE: at 5 mos pregnant  SUBJECTIVE:                                                                                                                                                                                           SUBJECTIVE STATEMENT: Pt reports that the exercises have been helping.     PERTINENT HISTORY:  EDD 10/15/22 Gestational diabetes Umbilical hernia repair 2001 Hx of 3 vaginal deliveries - this is 4th pregnancy  PAIN:   PAIN:  Are you having pain? Yes NPRS scale: 2/10 Pain location: central  Pain orientation: Right, Left, and Medial  PAIN TYPE: pressure and achey Pain description: intermittent  Aggravating factors: sitting too long and the baby is hanging out on right side all the time Relieving factors: stretching Rt leg back, getting on all 4s   PRECAUTIONS: Other: pregnant with EDD of 10/14/2022  WEIGHT BEARING RESTRICTIONS: No  FALLS:  Has patient fallen in last 6 months? No  LIVING ENVIRONMENT: Lives with: lives with their family Lives in: House/apartment Stairs:  Yes: Internal: 15 steps; on right going up and External: 3 steps; can reach both Has following equipment at home: None  OCCUPATION: full time computer work  PLOF: Independent  PATIENT GOALS: exercises to get some relief  NEXT MD VISIT: 1 week  OBJECTIVE:   DIAGNOSTIC FINDINGS:  N/a  PATIENT SURVEYS:  Eval:  Modified Oswestry 11/50   SCREENING FOR RED FLAGS: Bowel or bladder incontinence: Yes: with movement - can feel a panty liner Spinal tumors: No Cauda equina syndrome: No Compression fracture: No Abdominal aneurysm: No  COGNITION: Overall cognitive status: Within functional limits for tasks assessed     SENSATION: WFL  MUSCLE LENGTH: Hamstrings: Right 80 deg; Left 85 deg   POSTURE: increased lumbar lordosis, decreased thoracic kyphosis, and anterior pelvic tilt  PALPATION: Tender along central lumbar spine and SI joints, soft tissues non-tender along spine and hips  LUMBAR ROM:   AROM eval  Flexion Fingers to knees, relief  Extension WFL, pain  Right lateral flexion 10, contralateral stretch  Left lateral flexion 10, contralateral stretch  Right rotation 50%, tight  Left rotation 50%, tight   (Blank rows = not tested)  LOWER EXTREMITY ROM:    Hip ROM WNL bil - trending toward hypermobile  LOWER EXTREMITY MMT:    Bil hips 4/5, bil knees 4/5     FUNCTIONAL TESTS:  Able to balance in  SLS bil without pain , functional squat WNL without pain  GAIT: Distance walked: within clinic Assistive device utilized: None Level of assistance: Complete Independence Comments: increased frontal plane motion of trunk, knee valgus Lt  TODAY'S TREATMENT:                                                                                                                               DATE:  09/17/2022 Nustep level 5 x5 min with PT present to discuss status Seated on blue pball:  pelvic tilts and CW/CCW pelvic cirlcles x20 each Seated 3 way green pball rollout 3x10 sec each Quadruped cat/cow x10 Side-lying clamshell and reverse clamshell x10 bilat each Supine bent knee fall outs 2x10   DATE:  08/23/22  Pt education: Seated and standing pelvic hula hoop technique for fully emptying bladder after initial emptying given leakage after voiding Diaphragmatic breathing in SL with TA and PF contractions on exhale Initiated HEP - Pt reported relief with selected therex    PATIENT EDUCATION:  Education details: Access Code: [email or web address] Person educated: Patient Education method: Programmer, multimedia, Demonstration, Verbal cues, and Handouts Education comprehension: verbalized understanding and returned demonstration  HOME EXERCISE PROGRAM: Access Code: [email or web address] URL: [email or web address] Date: 08/23/2022 Prepared by: Loistine Simas Beuhring  Exercises - Quadruped Rocking Slow  - 1 x daily - 7 x weekly - 3 sets - 10 reps - Cat Cow  - 1 x daily - 7 x weekly - 3 sets - 10 reps - Seated Lumbar Flexion Stretch  - 1 x daily - 7 x weekly - 3  sets - 10 reps - Standing 'L' Stretch at Counter  - 1 x daily - 7 x weekly - 3 sets - 10 reps - Pelvic Circles on Swiss Ball  - 1 x daily - 7 x weekly - 1 sets - 10 reps - Pelvic Tilt on Swiss Ball  - 1 x daily - 7 x weekly - 1 sets - 10 reps  ASSESSMENT:  CLINICAL IMPRESSION: [name] arrives to PT clinic reporting that the exercises given at initial  evaluation do seem to be helping.  Patient was able to demonstrate them in clinic with only minimal cuing for improved technique, especially cat/cow.  Patient reported overall decreased pain during the exercises and she was provided with Active Labor Positions handout.  Patient issued updated HEP to continue to progress until she comes back for next scheduled PT visit to continue to progress with overall decreased pain.  OBJECTIVE IMPAIRMENTS: Abnormal gait, decreased activity tolerance, decreased balance, decreased coordination, decreased endurance, decreased mobility, difficulty walking, decreased ROM, decreased strength, hypomobility, increased fascial restrictions, increased muscle spasms, impaired flexibility, improper body mechanics, postural dysfunction, and pain.   ACTIVITY LIMITATIONS: carrying, lifting, bending, sitting, standing, squatting, sleeping, transfers, bed mobility, dressing, and locomotion level  PARTICIPATION LIMITATIONS: meal prep, cleaning, laundry, driving, shopping, and community activity  PERSONAL FACTORS: 1 comorbidity: pain in pregnancy  are also affecting patient's functional outcome.   REHAB POTENTIAL: Good  CLINICAL DECISION MAKING: Stable/uncomplicated  EVALUATION COMPLEXITY: Low   GOALS: Goals reviewed with patient? Yes  SHORT TERM GOALS: Target date: 09/20/22  Pt will be ind with initial HEP Baseline: Goal status: MET  2.  Pt will be educated about positioning and other strategies to improve sleep and daily pain/pressure through 3rd trimester of pregnancy. Baseline:  Goal status: IN PROGRESS  3.  Pt will learn how to contract deep abdominals with transitions, functional movements and bed mobility. Baseline:  Goal status: MET    LONG TERM GOALS: Target date: 10/15/22  Pt will be ind with advanced HEP for positioning for pressure relief, hip and pelvic mobility, and stabilization strategies for ongoing daily tasks. Baseline:  Goal status: IN  PROGRESS  2.  Pt will be educated on labor and delivery positioning options Baseline:  Goal status: IN PROGRESS  3.  Pt will report improved pain with daily task performance by at least 50% Baseline:  Goal status: INITIAL  4.  Pt will report improved sleep stretches of at least 6 consecutive hours with change in position as needed. Baseline:  Goal status: INITIAL  5.  Pt will improve ODI score to at most 8/50 to demo improved management of symptoms in pregnancy. Baseline: 11/50 Goal status: INITIAL  6.  Pt will be able to tolerate sitting for work with positional breaks most days of the week without exacerbation of pain above 4/10 Baseline:  Goal status: INITIAL  PLAN:  Every 2 weeks POC  PT DURATION: 8 weeks  PLANNED INTERVENTIONS: Therapeutic exercises, Therapeutic activity, Neuromuscular re-education, Gait training, Patient/Family education, Self Care, Joint mobilization, DME instructions, Aquatic Therapy, Dry Needling, Electrical stimulation, Spinal mobilization, Cryotherapy, Moist heat, Taping, and Manual therapy.  PLAN FOR NEXT SESSION: review HEP, body mechanics, gentle hip stabilization and trunk stretching, positioning for comfort at night for improved sleep   Leather Estis, PT 09/17/22 1:20 PM  [facility] [address], [address] [place], [place] [address] Phone # [phone] Fax [phone]

## 2022-09-17 NOTE — Patient Instructions (Signed)
ACTIVE LABOR POSITIONS: 1. At the start of labor at your baby is at the top of your pelvis, you can work on helping the baby  descend lower into the pelvis with positioning your knees wide/apart and your butt tucked.  Positions that help with this can be squatting with knees apart, you can have support from your  partner or the hospital bed for example. You can also try leaning forward and rocking front to  back, or sitting on a birthing ball. Tip: Holding onto something helps from getting as tired in this  position. 2. If baby above pubic bone focus on opening midpelvis. For this you can try using a peanut ball  between your legs and rocking front to back on your side. You can also stand and sway, try  rocking side to side on all fours and you can add shifting your hips back to front one at a time. Don't' forget to switch sides. If you can stand, having one foot on a stool or higher than the  other may improve opening the pelvis as well.  3. If baby is below pubic bone, you need to open the bottom of the pelvis. Think "knees in ankles  out" can you peanut ball between ankles to promote this position or on all fours with knees in  and ankles apart. If you don't have a peanut ball, your partner can help with holding your upper  leg in sidelying and have your knee lower than your ankle.  4. Remember to gentle push: Breath in to start pushing and breath put as you push downward. 5. Sidelying, kneeling or on all fours will allow more pelvic space for baby than on your back however do what feels the most comfortable for you and your baby. These positions are  suggestions based on where the baby is positioned during labor and every labor is different.  [email or web address] 

## 2022-09-18 ENCOUNTER — Ambulatory Visit: Payer: Medicaid Other | Admitting: *Deleted

## 2022-09-18 ENCOUNTER — Encounter: Payer: Self-pay | Admitting: *Deleted

## 2022-09-18 ENCOUNTER — Ambulatory Visit: Payer: Medicaid Other | Attending: Maternal & Fetal Medicine

## 2022-09-18 DIAGNOSIS — O24410 Gestational diabetes mellitus in pregnancy, diet controlled: Secondary | ICD-10-CM | POA: Diagnosis present

## 2022-09-18 DIAGNOSIS — Z3A36 36 weeks gestation of pregnancy: Secondary | ICD-10-CM

## 2022-09-18 DIAGNOSIS — Z8632 Personal history of gestational diabetes: Secondary | ICD-10-CM | POA: Insufficient documentation

## 2022-09-18 DIAGNOSIS — O09293 Supervision of pregnancy with other poor reproductive or obstetric history, third trimester: Secondary | ICD-10-CM | POA: Insufficient documentation

## 2022-09-19 ENCOUNTER — Encounter (HOSPITAL_COMMUNITY): Payer: Self-pay | Admitting: Obstetrics and Gynecology

## 2022-09-19 ENCOUNTER — Inpatient Hospital Stay (HOSPITAL_COMMUNITY)
Admission: AD | Admit: 2022-09-19 | Discharge: 2022-09-19 | Disposition: A | Discharge status: Home / Self Care | Payer: Medicaid Other | Attending: Obstetrics and Gynecology | Admitting: Obstetrics and Gynecology

## 2022-09-19 DIAGNOSIS — Z3A36 36 weeks gestation of pregnancy: Secondary | ICD-10-CM | POA: Diagnosis not present

## 2022-09-19 DIAGNOSIS — Z87891 Personal history of nicotine dependence: Secondary | ICD-10-CM | POA: Diagnosis not present

## 2022-09-19 DIAGNOSIS — Z0371 Encounter for suspected problem with amniotic cavity and membrane ruled out: Secondary | ICD-10-CM | POA: Diagnosis present

## 2022-09-19 LAB — WET PREP, GENITAL
Clue Cells Wet Prep HPF POC: NONE SEEN
Sperm: NONE SEEN
Trich, Wet Prep: NONE SEEN
WBC, Wet Prep HPF POC: >=10 — AB (ref <10)
Yeast Wet Prep HPF POC: NONE SEEN

## 2022-09-19 LAB — POCT FERN TEST: POCT Fern Test: NEGATIVE

## 2022-09-19 NOTE — MAU Provider Note (Signed)
History     [id number]  Arrival date and time: 09/19/22 4098    Chief Complaint  Patient presents with   Rupture of Membranes   Contractions     HPI Hayley Lawson is a 27 y.o. at [redacted]w[redacted]d with PMHx notable for GDMA1, who presents for leaking fluid.   Patient was getting ready for work and then had a large gush of clear fluid, doesn't think it was urine No vaginal bleeding Has had some discharge and is currently being treated for a yeast infection with topical cream Baby movement is normal No contractions currently   O/Positive/-- (11/20 0937)  OB History     Gravida  4   Para  3   Term  3   Preterm      AB      Living  3      SAB      IAB      Ectopic      Multiple      Live Births  3           Past Medical History:  Diagnosis Date   ADHD    Gestational diabetes     Past Surgical History:  Procedure Laterality Date   CHOLECYSTECTOMY  2019   UMBILICAL HERNIA REPAIR  2001    Family History  Problem Relation Age of Onset   Migraines Mother    Stroke Father    Depression Father    Anxiety disorder Father    Diabetes Father    Heart disease Maternal Grandmother    Alcohol abuse Neg Hx    Arthritis Neg Hx    Asthma Neg Hx    Birth defects Neg Hx    COPD Neg Hx    Cancer Neg Hx    Drug abuse Neg Hx    Early death Neg Hx    Hearing loss Neg Hx    Hyperlipidemia Neg Hx    Hypertension Neg Hx    Kidney disease Neg Hx    Learning disabilities Neg Hx    Mental illness Neg Hx    Mental retardation Neg Hx    Miscarriages / Stillbirths Neg Hx    Vision loss Neg Hx    Varicose Veins Neg Hx     Social History   Socioeconomic History   Marital status: Single    Spouse name: Not on file   Number of children: Not on file   Years of education: Not on file   Highest education level: Not on file  Occupational History   Not on file  Tobacco Use   Smoking status: Former   Smokeless tobacco: Never  Vaping Use   Vaping Use: Never used   Substance and Sexual Activity   Alcohol use: No   Drug use: No   Sexual activity: Yes    Birth control/protection: None  Other Topics Concern   Not on file  Social History Narrative   Not on file   Social Determinants of Health   Financial Resource Strain: Low Risk  (03/12/2022)   Overall Financial Resource Strain (CARDIA)    Difficulty of Paying Living Expenses: Not very hard  Food Insecurity: No Food Insecurity (03/12/2022)   Hunger Vital Sign    Worried About Running Out of Food in the Last Year: Never true    Ran Out of Food in the Last Year: Never true  Transportation Needs: No Transportation Needs (03/12/2022)   PRAPARE - Transportation    Lack of Transportation (  Medical): No    Lack of Transportation (Non-Medical): No  Physical Activity: Insufficiently Active (03/12/2022)   Exercise Vital Sign    Days of Exercise per Week: 6 days    Minutes of Exercise per Session: 10 min  Stress: Stress Concern Present (03/12/2022)   [employer] of Occupational Health - Occupational Stress Questionnaire    Feeling of Stress : To some extent  Social Connections: Socially Isolated (03/12/2022)   Social Connection and Isolation Panel [NHANES]    Frequency of Communication with Friends and Family: More than three times a week    Frequency of Social Gatherings with Friends and Family: Once a week    Attends Religious Services: Never    Database administrator or Organizations: No    Attends Banker Meetings: Never    Marital Status: Never married  Intimate Partner Violence: Not At Risk (03/12/2022)   Humiliation, Afraid, Rape, and Kick questionnaire    Fear of Current or Ex-Partner: No    Emotionally Abused: No    Physically Abused: No    Sexually Abused: No    Allergies  Allergen Reactions   Bactrim [Sulfamethoxazole-Trimethoprim] Rash   Doxycycline Rash    No current facility-administered medications on file prior to encounter.   Current Outpatient  Medications on File Prior to Encounter  Medication Sig Dispense Refill   Accu-Chek Softclix Lancets lancets Use as instructed 100 each 6   amphetamine-dextroamphetamine (ADDERALL XR) 20 MG 24 hr capsule Take 1 capsule (20 mg total) by mouth every morning. 30 capsule 0   blood glucose meter kit and supplies KIT Dispense based on patient and insurance preference. Use up to four times daily as directed. 1 each 0   Prenatal Vit-Fe Fumarate-FA (PRENATAL MULTIVITAMIN) TABS tablet Take 1 tablet by mouth daily at 12 noon.     albuterol (VENTOLIN HFA) 108 (90 Base) MCG/ACT inhaler Inhale 2 puffs into the lungs every 6 (six) hours as needed for wheezing or shortness of breath. (Patient not taking: Reported on 08/14/2022) 8 g 0   aspirin EC 81 MG tablet Take 1 tablet (81 mg total) by mouth daily. Swallow whole. (Patient not taking: Reported on 05/14/2022) 30 tablet 12   promethazine (PHENERGAN) 25 MG tablet Take 1 tablet (25 mg total) by mouth every 6 (six) hours as needed for nausea or vomiting. 30 tablet 1     ROS Pertinent positives and negative per HPI, all others reviewed and negative  Physical Exam   BP 123/81   Pulse 98   Temp 98.3 F (36.8 C) (Oral)   Resp 17   Ht 5\' 2"  (1.575 m)   Wt 87.6 kg   LMP 01/08/2022   SpO2 99%   BMI 35.34 kg/m   Patient Vitals for the past 24 hrs:  BP Temp Temp src Pulse Resp SpO2 Height Weight  09/19/22 0930 123/81 -- -- 98 17 -- -- --  09/19/22 0920 125/86 98.3 F (36.8 C) Oral (!) 103 16 99 % -- --  09/19/22 0917 -- -- -- -- -- -- 5\' 2"  (1.575 m) 87.6 kg    Physical Exam Vitals reviewed.  Constitutional:      General: She is not in acute distress.    Appearance: She is well-developed. She is not diaphoretic.  Eyes:     General: No scleral icterus. Pulmonary:     Effort: Pulmonary effort is normal. No respiratory distress.  Abdominal:     General: There is no distension.  Palpations: Abdomen is soft.     Tenderness: There is no abdominal  tenderness. There is no guarding or rebound.  Genitourinary:    Comments: Neg valsalva, neg pool, neg fern Skin:    General: Skin is warm and dry.  Neurological:     Mental Status: She is alert.     Coordination: Coordination normal.      Cervical Exam    Bedside Ultrasound Not performed.  My interpretation: n/a  FHT Baseline: 150 bpm Variability: Good {> 6 bpm) Accelerations: Reactive Decelerations: Absent Uterine activity: irritability Cat: I  Labs Results for orders placed or performed during the hospital encounter of 09/19/22 (from the past 24 hour(s))  Wet prep, genital     Status: Abnormal   Collection Time: 09/19/22 10:05 AM  Result Value Ref Range   Yeast Wet Prep HPF POC NONE SEEN NONE SEEN   Trich, Wet Prep NONE SEEN NONE SEEN   Clue Cells Wet Prep HPF POC NONE SEEN NONE SEEN   WBC, Wet Prep HPF POC >=10 (A) <10   Sperm NONE SEEN   Fern Test     Status: Normal   Collection Time: 09/19/22 10:08 AM  Result Value Ref Range   POCT Fern Test Negative = intact amniotic membranes     Imaging [place] MFM OB FOLLOW UP  Result Date: 09/18/2022 ----------------------------------------------------------------------  OBSTETRICS REPORT                        (Signed Final 09/18/2022 04:13 pm) ---------------------------------------------------------------------- Patient Info  ID #:       [id number]                          D.O.B.:  12/04/1995 (27 yrs)  Name:       Hayley Lawson                  Visit Date: 09/18/2022 03:37 pm ---------------------------------------------------------------------- Performed By  Attending:        Lin Landsman      Referred By:       Jerene Bears                    MD  Performed By:     Marcellina Millin       [facility]                                                              [facility] ---------------------------------------------------------------------- Orders  #  Description                           Code        Ordered By  1  [place] MFM OB FOLLOW UP                   [phone]    Braxton Feathers ----------------------------------------------------------------------  #  Order #                     Accession #                Episode #  1  [id number]                   [id number]                 [id number] ---------------------------------------------------------------------- Indications  Gestational diabetes in pregnancy, diet         O24.410  controlled  Encounter for other antenatal screening         Z36.2  follow-up  [redacted] weeks gestation of pregnancy                 Z3A.36  NIPS low risk female, AFP neg, Horizon neg ---------------------------------------------------------------------- Vital Signs                                                 Height:        5'2" ---------------------------------------------------------------------- Fetal Evaluation  Num Of Fetuses:          1  Fetal Heart Rate(bpm):   130  Cardiac Activity:        Observed  Presentation:            Cephalic  Placenta:                Posterior  P. Cord Insertion:       Previously visualized  Amniotic Fluid  AFI FV:      Within normal limits  AFI Sum(cm)     %Tile       Largest Pocket(cm)  14.42           53          4.83  RUQ(cm)       RLQ(cm)       LUQ(cm)        LLQ(cm)  4.83          1.75          3.05           4.79 ---------------------------------------------------------------------- Biometry  BPD:        88  mm     G. Age:  35w 4d         42  %    CI:        74.79   %    70 - 86                                                          FL/HC:       20.8  %  20.1 - 22.1  HC:      322.9  mm     G. Age:  36w 3d         28  %    HC/AC:       0.98       0.93 - 1.11  AC:      329.1  mm     G. Age:  36w 6d         78  %    FL/BPD:      76.5  %    71 - 87  FL:       67.3  mm     G. Age:  34w 4d         12  %    FL/AC:        20.4  %    20 - 24  Est. FW:    2847   gm     6 lb 4 oz     50  % ---------------------------------------------------------------------- OB History  Gravidity:    4         Term:   3  Living:       3 ---------------------------------------------------------------------- Gestational Age  LMP:           36w 1d        Date:  01/08/22                  EDD:   10/15/22  U/S Today:     35w 6d                                        EDD:   10/17/22  Best:          36w 1d     Det. By:  LMP  (01/08/22)          EDD:   10/15/22 ---------------------------------------------------------------------- Anatomy  Cranium:               Appears normal         LVOT:                   Previously seen  Cavum:                 Previously seen        Aortic Arch:            Previously seen  Ventricles:            Previously seen        Ductal Arch:            Previously seen  Choroid Plexus:        Previously seen        Diaphragm:              Appears normal  Cerebellum:            Previously seen        Stomach:                Appears normal, left  sided  Posterior Fossa:       Previously seen        Abdomen:                Appears normal  Nuchal Fold:           Previously seen        Abdominal Wall:         Previously seen  Face:                  Orbits and profile     Cord Vessels:           Previously seen                         previously seen  Lips:                  Previously seen        Kidneys:                Appear normal  Palate:                Previously seen        Bladder:                Appears normal  Thoracic:              Previously seen        Spine:                  Previously seen  Heart:                 Previously seen        Upper Extremities:      Previously seen  RVOT:                  Previously seen        Lower Extremities:      Previously seen  Other:  Female gender previously seen. Heels/feet and open hands/5th          digits, Nasal bone,  lenses, maxilla, mandible and falx, VC, 3VV and          3VTV previously visualized. ---------------------------------------------------------------------- Impression  Follow up growth due A1GDM  Normal interval growth with measurements consistent with  dates  Good fetal movement and amniotic fluid volume ---------------------------------------------------------------------- Recommendations  Initiate weekly testing if medical therapy is initiated. ----------------------------------------------------------------------              Lin Landsman, MD Electronically Signed Final Report   09/18/2022 04:13 pm ----------------------------------------------------------------------   MAU Course  Procedures Lab Orders         Wet prep, genital         Fern Test    No orders of the defined types were placed in this encounter.  Imaging Orders  No imaging studies ordered today    MDM Moderate (Level 3-4)  Assessment and Plan  #Encounter for suspected rupture of membranes, with rupture of membranes not found #[redacted] weeks gestation of pregnancy Neg pool, neg fern on spec exam. Wet prep unremarkable.   #FWB FHT Cat I NST: Reactive   Dispo: discharged to home in stable condition    Venora Maples, MD/MPH 09/19/22 10:47 AM  Allergies as of 09/19/2022       Reactions   Bactrim [sulfamethoxazole-trimethoprim] Rash   Doxycycline Rash        Medication List  TAKE these medications    Accu-Chek Softclix Lancets lancets Use as instructed   albuterol 108 (90 Base) MCG/ACT inhaler Commonly known as: VENTOLIN HFA Inhale 2 puffs into the lungs every 6 (six) hours as needed for wheezing or shortness of breath.   amphetamine-dextroamphetamine 20 MG 24 hr capsule Commonly known as: ADDERALL XR Take 1 capsule (20 mg total) by mouth every morning.   aspirin EC 81 MG tablet Take 1 tablet (81 mg total) by mouth daily. Swallow whole.   blood glucose meter kit and supplies Kit Dispense  based on patient and insurance preference. Use up to four times daily as directed.   prenatal multivitamin Tabs tablet Take 1 tablet by mouth daily at 12 noon.   promethazine 25 MG tablet Commonly known as: PHENERGAN Take 1 tablet (25 mg total) by mouth every 6 (six) hours as needed for nausea or vomiting.

## 2022-09-19 NOTE — Telephone Encounter (Signed)
Called pt in response to [employer]. Pt states that she had a large gush of fluid that was clear this morning. She reports some fluid leakage after that was yellow in color. She reports possible contractions that are painful. She has not been able to time them. She reports positive fetal movement. Advised pt to go to [facility]. Pt verbalized understanding.

## 2022-09-19 NOTE — MAU Note (Addendum)
...  Hayley Lawson is a 27 y.o. at [redacted]w[redacted]d here in MAU reporting: Gush of fluids around 0745 this morning. She reports the first gush was clear fluids and since then she has been leaking yellow fluids. She also reports having intermittent cramps at the top of her abdomen all morning. Denies VB. +FM.  GDM. Diet controlled.  Onset of complaint: 0745 Pain score: 2/10 upper abdomen   FHT: patient wearing a belly band and tight dress - unable to doppler Lab orders placed from triage:  MAU Labor Eval

## 2022-09-24 ENCOUNTER — Encounter (HOSPITAL_BASED_OUTPATIENT_CLINIC_OR_DEPARTMENT_OTHER): Payer: Self-pay | Admitting: Advanced Practice Midwife

## 2022-09-25 ENCOUNTER — Encounter (HOSPITAL_BASED_OUTPATIENT_CLINIC_OR_DEPARTMENT_OTHER): Payer: Self-pay

## 2022-09-25 ENCOUNTER — Encounter (HOSPITAL_BASED_OUTPATIENT_CLINIC_OR_DEPARTMENT_OTHER): Payer: Self-pay | Admitting: Advanced Practice Midwife

## 2022-09-25 ENCOUNTER — Telehealth (INDEPENDENT_AMBULATORY_CARE_PROVIDER_SITE_OTHER): Payer: Medicaid Other | Admitting: Advanced Practice Midwife

## 2022-09-25 VITALS — BP 120/80

## 2022-09-25 DIAGNOSIS — O24415 Gestational diabetes mellitus in pregnancy, controlled by oral hypoglycemic drugs: Secondary | ICD-10-CM

## 2022-09-25 DIAGNOSIS — Z3483 Encounter for supervision of other normal pregnancy, third trimester: Secondary | ICD-10-CM

## 2022-09-25 DIAGNOSIS — Z3A37 37 weeks gestation of pregnancy: Secondary | ICD-10-CM

## 2022-09-25 NOTE — Progress Notes (Addendum)
OBSTETRICS PRENATAL VIRTUAL VISIT ENCOUNTER NOTE  Provider location: [facility]   Patient location: Home  I connected with Hayley Lawson on 09/25/22 at  4:15 PM EDT by MyChart Video Encounter and verified that I am speaking with the correct person using two identifiers. I discussed the limitations, risks, security and privacy concerns of performing an evaluation and management service virtually and the availability of in person appointments. I also discussed with the patient that there may be a patient responsible charge related to this service. The patient expressed understanding and agreed to proceed. Subjective:  Hayley Lawson is a 27 y.o. G4P3003 at [redacted]w[redacted]d being seen today for ongoing prenatal care.  She is currently monitored for the following issues for this low-risk pregnancy and has Migraines; ADHD; Pregnancy, supervision, high-risk, third trimester; History of gestational diabetes; GDM (gestational diabetes mellitus); and Viral URI on their problem list.  Patient reports no complaints.   . Vag. Bleeding: None.  Movement: Present. Denies any leaking of fluid.   The following portions of the patient's history were reviewed and updated as appropriate: allergies, current medications, past family history, past medical history, past social history, past surgical history and problem list.   Objective:   Vitals:   09/25/22 1745  BP: 120/80    Fetal Status:     Movement: Present     General:  Alert, oriented and cooperative. Patient is in no acute distress.  Respiratory: Normal respiratory effort, no problems with respiration noted  Mental Status: Normal mood and affect. Normal behavior. Normal judgment and thought content.  Rest of physical exam deferred due to type of encounter  Imaging: [place] MFM OB FOLLOW UP  Result Date: 09/18/2022 ----------------------------------------------------------------------  OBSTETRICS REPORT                         (Signed Final 09/18/2022 04:13 pm) ---------------------------------------------------------------------- Patient Info  ID #:       [id number]                          D.O.B.:  08-03-1995 (27 yrs)  Name:       Hayley Lawson                  Visit Date: 09/18/2022 03:37 pm ---------------------------------------------------------------------- Performed By  Attending:        Lin Landsman      Referred By:       Jerene Bears                    MD  Performed By:     Marcellina Millin       [facility] ---------------------------------------------------------------------- Orders  #  Description                           Code        Ordered By  1  [place] MFM OB FOLLOW UP                   [phone]    Braxton Feathers ----------------------------------------------------------------------  #  Order #                     Accession #                Episode #  1  [id number]                   [id number]                 [id number] ---------------------------------------------------------------------- Indications  Gestational diabetes in pregnancy, diet         O24.410  controlled  Encounter for other antenatal screening         Z36.2  follow-up  [redacted] weeks gestation of pregnancy                 Z3A.36  NIPS low risk female, AFP neg, Horizon neg ---------------------------------------------------------------------- Vital Signs                                                 Height:        5'2" ---------------------------------------------------------------------- Fetal Evaluation  Num Of Fetuses:          1  Fetal Heart Rate(bpm):   130  Cardiac Activity:        Observed  Presentation:            Cephalic  Placenta:                Posterior  P. Cord Insertion:       Previously visualized  Amniotic  Fluid  AFI FV:      Within normal limits  AFI Sum(cm)     %Tile       Largest Pocket(cm)  14.42           53          4.83  RUQ(cm)       RLQ(cm)       LUQ(cm)        LLQ(cm)  4.83          1.75          3.05           4.79 ---------------------------------------------------------------------- Biometry  BPD:        88  mm     G. Age:  35w 4d         42  %    CI:        74.79   %    70 - 86  FL/HC:       20.8  %    20.1 - 22.1  HC:      322.9  mm     G. Age:  36w 3d         28  %    HC/AC:       0.98       0.93 - 1.11  AC:      329.1  mm     G. Age:  36w 6d         78  %    FL/BPD:      76.5  %    71 - 87  FL:       67.3  mm     G. Age:  34w 4d         12  %    FL/AC:       20.4  %    20 - 24  Est. FW:    2847   gm     6 lb 4 oz     50  % ---------------------------------------------------------------------- OB History  Gravidity:    4         Term:   3  Living:       3 ---------------------------------------------------------------------- Gestational Age  LMP:           36w 1d        Date:  01/08/22                  EDD:   10/15/22  U/S Today:     35w 6d                                        EDD:   10/17/22  Best:          36w 1d     Det. By:  LMP  (01/08/22)          EDD:   10/15/22 ---------------------------------------------------------------------- Anatomy  Cranium:               Appears normal         LVOT:                   Previously seen  Cavum:                 Previously seen        Aortic Arch:            Previously seen  Ventricles:            Previously seen        Ductal Arch:            Previously seen  Choroid Plexus:        Previously seen        Diaphragm:              Appears normal  Cerebellum:            Previously seen        Stomach:                Appears normal, left  sided  Posterior Fossa:       Previously seen        Abdomen:                Appears normal  Nuchal  Fold:           Previously seen        Abdominal Wall:         Previously seen  Face:                  Orbits and profile     Cord Vessels:           Previously seen                         previously seen  Lips:                  Previously seen        Kidneys:                Appear normal  Palate:                Previously seen        Bladder:                Appears normal  Thoracic:              Previously seen        Spine:                  Previously seen  Heart:                 Previously seen        Upper Extremities:      Previously seen  RVOT:                  Previously seen        Lower Extremities:      Previously seen  Other:  Female gender previously seen. Heels/feet and open hands/5th          digits, Nasal bone, lenses, maxilla, mandible and falx, VC, 3VV and          3VTV previously visualized. ---------------------------------------------------------------------- Impression  Follow up growth due A1GDM  Normal interval growth with measurements consistent with  dates  Good fetal movement and amniotic fluid volume ---------------------------------------------------------------------- Recommendations  Initiate weekly testing if medical therapy is initiated. ----------------------------------------------------------------------              Lin Landsman, MD Electronically Signed Final Report   09/18/2022 04:13 pm ----------------------------------------------------------------------  US Venous Img Lower Unilateral Right (DVT)  Result Date: 08/27/2022 CLINICAL DATA:  Right lower extremity tenderness and pain. EXAM: RIGHT LOWER EXTREMITY VENOUS DOPPLER ULTRASOUND TECHNIQUE: Gray-scale sonography with graded compression, as well as color Doppler and duplex ultrasound were performed to evaluate the lower extremity deep venous systems from the level of the common femoral vein and including the common femoral, femoral, profunda femoral, popliteal and calf veins including the posterior tibial, peroneal  and gastrocnemius veins when visible. The superficial great saphenous vein was also interrogated. Spectral Doppler was utilized to evaluate flow at rest and with distal augmentation maneuvers in the common femoral, femoral and popliteal veins. COMPARISON:  None Available. FINDINGS: Contralateral Common Femoral Vein: Respiratory phasicity is normal and symmetric with the symptomatic side. No evidence of thrombus. Normal compressibility. Common Femoral Vein: No evidence of thrombus. Normal compressibility, respiratory  phasicity and response to augmentation. Saphenofemoral Junction: No evidence of thrombus. Normal compressibility and flow on color Doppler imaging. Profunda Femoral Vein: No evidence of thrombus. Normal compressibility and flow on color Doppler imaging. Femoral Vein: No evidence of thrombus. Normal compressibility, respiratory phasicity and response to augmentation. Popliteal Vein: No evidence of thrombus. Normal compressibility, respiratory phasicity and response to augmentation. Calf Veins: No evidence of thrombus. Normal compressibility and flow on color Doppler imaging. Superficial Great Saphenous Vein: No evidence of thrombus. Normal compressibility. Venous Reflux:  None. Other Findings: No evidence of superficial thrombophlebitis, abnormal fluid collection or visualized soft tissue mass. IMPRESSION: No evidence of right lower extremity deep venous thrombosis. Electronically Signed   By: Irish Lack M.D.   On: 08/27/2022 15:24    Assessment and Plan:  Pregnancy: G4P3003 at [redacted]w[redacted]d 1. Encounter for supervision of other normal pregnancy in third trimester --Pt reports good fetal movement, denies cramping, LOF, or vaginal bleeding  --Anticipatory guidance about next visits/weeks of pregnancy given.  --GBS/GCC at next visit, as today's visit is virtual --no barriers to waterbirth at this time  2. Gestational diabetes mellitus (GDM) in third trimester controlled on oral hypoglycemic  drug --Reviewed glucose log, fasting and PP all within range    3. [redacted] weeks gestation of pregnancy    Term labor symptoms and general obstetric precautions including but not limited to vaginal bleeding, contractions, leaking of fluid and fetal movement were reviewed in detail with the patient. I discussed the assessment and treatment plan with the patient. The patient was provided an opportunity to ask questions and all were answered. The patient agreed with the plan and demonstrated an understanding of the instructions. The patient was advised to call back or seek an in-person office evaluation/go to MAU at [facility] for any urgent or concerning symptoms. Please refer to After Visit Summary for other counseling recommendations.   I provided 7 minutes of face-to-face time during this encounter.  No follow-ups on file.  Future Appointments  Date Time Provider Department Center  09/26/2022 10:15 AM Beuhring, Doyce Para, PT [facility] None  10/01/2022  3:55 PM Jerene Bears, MD [facility]  10/08/2022  3:35 PM Jerene Bears, MD [facility]  10/09/2022 10:15 AM Beuhring, Doyce Para, PT [facility] None  10/15/2022  3:55 PM Jerene Bears, MD [facility]    Sharen Counter, CNM [facility], [facility]

## 2022-09-26 ENCOUNTER — Encounter: Payer: Medicaid Other | Admitting: Physical Therapy

## 2022-09-26 ENCOUNTER — Ambulatory Visit: Payer: Medicaid Other | Admitting: Physical Therapy

## 2022-09-26 ENCOUNTER — Telehealth: Payer: Self-pay | Admitting: Physical Therapy

## 2022-09-26 NOTE — Telephone Encounter (Signed)
Pt was a no show for PT appointment on 09/26/22 at 10:15am.  PT tried to reach Pt but mailbox was full and unable to leave a voicemail.  Pt has one more scheduled PT visit before due date/discharge from PT.    Venetta Knee, PT 09/26/22 10:34 AM

## 2022-10-01 ENCOUNTER — Other Ambulatory Visit (HOSPITAL_COMMUNITY)
Admission: RE | Admit: 2022-10-01 | Discharge: 2022-10-01 | Disposition: A | Discharge status: Home / Self Care | Payer: Medicaid Other | Source: Ambulatory Visit | Attending: Obstetrics & Gynecology | Admitting: Obstetrics & Gynecology

## 2022-10-01 ENCOUNTER — Encounter (HOSPITAL_BASED_OUTPATIENT_CLINIC_OR_DEPARTMENT_OTHER): Payer: Self-pay | Admitting: Obstetrics & Gynecology

## 2022-10-01 ENCOUNTER — Ambulatory Visit (INDEPENDENT_AMBULATORY_CARE_PROVIDER_SITE_OTHER): Payer: Medicaid Other | Admitting: Obstetrics & Gynecology

## 2022-10-01 VITALS — BP 128/84 | HR 114 | Wt 197.0 lb

## 2022-10-01 DIAGNOSIS — Z3A38 38 weeks gestation of pregnancy: Secondary | ICD-10-CM | POA: Insufficient documentation

## 2022-10-01 DIAGNOSIS — Z3483 Encounter for supervision of other normal pregnancy, third trimester: Secondary | ICD-10-CM | POA: Insufficient documentation

## 2022-10-01 DIAGNOSIS — F902 Attention-deficit hyperactivity disorder, combined type: Secondary | ICD-10-CM

## 2022-10-01 DIAGNOSIS — O24415 Gestational diabetes mellitus in pregnancy, controlled by oral hypoglycemic drugs: Secondary | ICD-10-CM

## 2022-10-01 DIAGNOSIS — O0993 Supervision of high risk pregnancy, unspecified, third trimester: Secondary | ICD-10-CM

## 2022-10-01 NOTE — Progress Notes (Signed)
   PRENATAL VISIT NOTE  Subjective:  Hayley Lawson is a 27 y.o. G4P3003 at [redacted]w[redacted]d being seen today for ongoing prenatal care.  She is currently monitored for the following issues for this low-risk pregnancy and has Migraines; ADHD; Pregnancy, supervision, high-risk, third trimester; History of gestational diabetes; GDM (gestational diabetes mellitus); and Viral URI on their problem list.  Patient reports  she's feeling some pressure .  Contractions: Not present. Vag. Bleeding: None.  Movement: Present. Denies leaking of fluid.   Blood sugars are all good with fasting < 100 (except 1 values of 122)  and pp < 120 (except a single 125).  Brought these with her today.  Will have scanned into EPIC  The following portions of the patient's history were reviewed and updated as appropriate: allergies, current medications, past family history, past medical history, past social history, past surgical history and problem list.   Objective:   Vitals:   10/01/22 1628  BP: 128/84  Pulse: (!) 114  Weight: 197 lb (89.4 kg)    Fetal Status: Fetal Heart Rate (bpm): 162 Fundal Height: 39 cm Movement: Present  Presentation: Vertex  General:  Alert, oriented and cooperative. Patient is in no acute distress.  Skin: Skin is warm and dry. No rash noted.   Cardiovascular: Normal heart rate noted  Respiratory: Normal respiratory effort, no problems with respiration noted  Abdomen: Soft, gravid, appropriate for gestational age.  Pain/Pressure: Present     Pelvic: Cervical exam performed in the presence of a chaperone Dilation: 4 Effacement (%): 50 Station: -3  Extremities: Normal range of motion.  Edema: Trace  Mental Status: Normal mood and affect. Normal behavior. Normal judgment and thought content.   Assessment and Plan:  Pregnancy: G4P3003 at [redacted]w[redacted]d 1. Encounter for supervision of other normal pregnancy in third trimester - recheck 1 weeks - signs/symptoms labor, reasons to go to MAU discussed  2. [redacted]  weeks gestation of pregnancy - Culture, beta strep (group b only) - Cervicovaginal ancillary only( [place])  3. Pregnancy, supervision, high-risk, third trimester  4. Gestational diabetes mellitus (GDM) in third trimester controlled on oral hypoglycemic drug - recommended continuing checking BSs  5. Attention deficit hyperactivity disorder (ADHD), combined type - pt is taking her Adderall XL - last growth scan 2847g, 50 %-tile on 09/18/2022  Term labor symptoms and general obstetric precautions including but not limited to vaginal bleeding, contractions, leaking of fluid and fetal movement were reviewed in detail with the patient. Please refer to After Visit Summary for other counseling recommendations.   Return in about 1 week (around 10/08/2022).  Future Appointments  Date Time Provider Department Center  10/08/2022  3:35 PM Jerene Bears, MD [facility]  10/15/2022  3:55 PM Jerene Bears, MD [facility]    Jerene Bears, MD

## 2022-10-02 LAB — CERVICOVAGINAL ANCILLARY ONLY
Chlamydia: NEGATIVE
Comment: NEGATIVE
Comment: NORMAL
Neisseria Gonorrhea: NEGATIVE

## 2022-10-04 ENCOUNTER — Other Ambulatory Visit (HOSPITAL_BASED_OUTPATIENT_CLINIC_OR_DEPARTMENT_OTHER): Payer: Self-pay | Admitting: Family Medicine

## 2022-10-04 DIAGNOSIS — F902 Attention-deficit hyperactivity disorder, combined type: Secondary | ICD-10-CM

## 2022-10-05 LAB — CULTURE, BETA STREP (GROUP B ONLY): Strep Gp B Culture: POSITIVE — AB

## 2022-10-05 MED ORDER — AMPHETAMINE-DEXTROAMPHET ER 20 MG PO CP24: 20.0000 mg | ORAL_CAPSULE | ORAL | 0 refills | Status: DC

## 2022-10-06 ENCOUNTER — Inpatient Hospital Stay (HOSPITAL_COMMUNITY)
Admission: AD | Admit: 2022-10-06 | Discharge: 2022-10-06 | Disposition: A | Discharge status: Home / Self Care | Payer: Medicaid Other | Attending: Obstetrics and Gynecology | Admitting: Obstetrics and Gynecology

## 2022-10-06 ENCOUNTER — Encounter (HOSPITAL_COMMUNITY): Payer: Self-pay | Admitting: Obstetrics and Gynecology

## 2022-10-06 DIAGNOSIS — O471 False labor at or after 37 completed weeks of gestation: Secondary | ICD-10-CM | POA: Diagnosis not present

## 2022-10-06 DIAGNOSIS — O368130 Decreased fetal movements, third trimester, not applicable or unspecified: Secondary | ICD-10-CM | POA: Insufficient documentation

## 2022-10-06 DIAGNOSIS — Z3A38 38 weeks gestation of pregnancy: Secondary | ICD-10-CM | POA: Insufficient documentation

## 2022-10-06 LAB — AMNISURE RUPTURE OF MEMBRANE (ROM) NOT AT ARMC: Amnisure ROM: NEGATIVE

## 2022-10-06 LAB — POCT FERN TEST: POCT Fern Test: NEGATIVE

## 2022-10-06 NOTE — MAU Note (Signed)
.  Hayley Lawson is a 27 y.o. at [redacted]w[redacted]d here in MAU reporting: DFM all day today. Last felt movement one hour ago. She reports she also had one gush of fluids around 1615 but none since. Reports the fluids were clear with no odor. Denies VB.   Pain score: Denies pain.  FHT: 150 initial external Lab orders placed from triage: Crist Fat, Fetal Movement Clicker given

## 2022-10-06 NOTE — Discharge Instructions (Signed)
2/3-1-1 Rule Go to MAU for painful contractions every 2-3 minutes, lasting 1 minute each for 1.5 hours.  

## 2022-10-06 NOTE — MAU Provider Note (Signed)
History     CSN: [id number]  Arrival date and time: 10/06/22 1815     Chief Complaint  Patient presents with   Rupture of Membranes   Decreased Fetal Movement   HPI [name] is a 27 y.o. year old G67P3003 female at [redacted]w[redacted]d weeks gestation who presents to MAU reporting DFM all day today; last felt movement 1 hour ago. She reports she felt one gush of fluids around 1615, but none since. She reports the fluids were clear with no odor. She denies any VB. She denies any pain. She receives [facility] with [facility]; next appt is 10/08/2022.   OB History     Gravida  4   Para  3   Term  3   Preterm      AB      Living  3      SAB      IAB      Ectopic      Multiple      Live Births  3           Past Medical History:  Diagnosis Date   ADHD    Gestational diabetes     Past Surgical History:  Procedure Laterality Date   CHOLECYSTECTOMY  2019   UMBILICAL HERNIA REPAIR  2001    Family History  Problem Relation Age of Onset   Migraines Mother    Stroke Father    Depression Father    Anxiety disorder Father    Diabetes Father    Heart disease Maternal Grandmother    Alcohol abuse Neg Hx    Arthritis Neg Hx    Asthma Neg Hx    Birth defects Neg Hx    COPD Neg Hx    Cancer Neg Hx    Drug abuse Neg Hx    Early death Neg Hx    Hearing loss Neg Hx    Hyperlipidemia Neg Hx    Hypertension Neg Hx    Kidney disease Neg Hx    Learning disabilities Neg Hx    Mental illness Neg Hx    Mental retardation Neg Hx    Miscarriages / Stillbirths Neg Hx    Vision loss Neg Hx    Varicose Veins Neg Hx     Social History   Tobacco Use   Smoking status: Former   Smokeless tobacco: Never  Building services engineer Use: Never used  Substance Use Topics   Alcohol use: No   Drug use: No    Allergies:  Allergies  Allergen Reactions   Bactrim [Sulfamethoxazole-Trimethoprim] Rash   Doxycycline Rash    Medications Prior to Admission  Medication Sig Dispense Refill  Last Dose   Accu-Chek Softclix Lancets lancets Use as instructed 100 each 6 10/06/2022   amphetamine-dextroamphetamine (ADDERALL XR) 20 MG 24 hr capsule Take 1 capsule (20 mg total) by mouth every morning. 30 capsule 0 10/06/2022   blood glucose meter kit and supplies KIT Dispense based on patient and insurance preference. Use up to four times daily as directed. 1 each 0 10/06/2022   Prenatal Vit-Fe Fumarate-FA (PRENATAL MULTIVITAMIN) TABS tablet Take 1 tablet by mouth daily at 12 noon.   10/06/2022   albuterol (VENTOLIN HFA) 108 (90 Base) MCG/ACT inhaler Inhale 2 puffs into the lungs every 6 (six) hours as needed for wheezing or shortness of breath. (Patient not taking: Reported on 08/14/2022) 8 g 0    aspirin EC 81 MG tablet Take 1 tablet (81 mg  total) by mouth daily. Swallow whole. (Patient not taking: Reported on 05/14/2022) 30 tablet 12    promethazine (PHENERGAN) 25 MG tablet Take 1 tablet (25 mg total) by mouth every 6 (six) hours as needed for nausea or vomiting. (Patient not taking: Reported on 10/01/2022) 30 tablet 1     Review of Systems  Constitutional: Negative.   HENT: Negative.    Eyes: Negative.   Respiratory: Negative.    Cardiovascular: Negative.   Gastrointestinal: Negative.   Endocrine: Negative.   Genitourinary:  Positive for vaginal discharge (leaked clear fluid x 1 episode).  Musculoskeletal: Negative.   Skin: Negative.   Allergic/Immunologic: Negative.   Neurological: Negative.   Hematological: Negative.   Psychiatric/Behavioral: Negative.     Physical Exam   Blood pressure 124/87, pulse 99, height 5\' 2"  (1.575 m), weight 90.5 kg, last menstrual period 01/08/2022.  Physical Exam Vitals and nursing note reviewed.  Constitutional:      Appearance: Normal appearance. She is normal weight.  Cardiovascular:     Rate and Rhythm: Normal rate.  Pulmonary:     Effort: Pulmonary effort is normal.  Genitourinary:    Comments: Fern and Amnisure collected by RN Musculoskeletal:         General: Normal range of motion.  Skin:    General: Skin is warm and dry.  Neurological:     Mental Status: She is alert and oriented to person, place, and time.  Psychiatric:        Mood and Affect: Mood normal.        Behavior: Behavior normal.        Thought Content: Thought content normal.        Judgment: Judgment normal.    REACTIVE NST - FHR: 140 bpm / moderate variability / accels present>>lots of FM detected / decels absent / TOCO: UI noted  MAU Course  Procedures  MDM Crist Fat Slide Amnisure  Results for orders placed or performed during the hospital encounter of 10/06/22 (from the past 24 hour(s))  Fern Test     Status: Normal   Collection Time: 10/06/22  6:50 PM  Result Value Ref Range   POCT Fern Test Negative = intact amniotic membranes   Amnisure rupture of membrane (rom)not at [facility]     Status: None   Collection Time: 10/06/22  6:54 PM  Result Value Ref Range   Amnisure ROM NEGATIVE     Assessment and Plan  1. False labor after 37 completed weeks of gestation - Information provided on false labor   2. [redacted] weeks gestation of pregnancy   - Discharge patient - Keep scheduled appt with [facility] on 10/08/2022 - Patient verbalized an understanding of the plan of care and agrees.    Raelyn Mora, CNM 10/06/2022, 7:58 PM

## 2022-10-08 ENCOUNTER — Ambulatory Visit (INDEPENDENT_AMBULATORY_CARE_PROVIDER_SITE_OTHER): Payer: Medicaid Other | Admitting: Obstetrics & Gynecology

## 2022-10-08 VITALS — BP 126/89 | HR 107 | Wt 198.8 lb

## 2022-10-08 DIAGNOSIS — F902 Attention-deficit hyperactivity disorder, combined type: Secondary | ICD-10-CM | POA: Diagnosis not present

## 2022-10-08 DIAGNOSIS — O0993 Supervision of high risk pregnancy, unspecified, third trimester: Secondary | ICD-10-CM

## 2022-10-08 DIAGNOSIS — O24415 Gestational diabetes mellitus in pregnancy, controlled by oral hypoglycemic drugs: Secondary | ICD-10-CM

## 2022-10-08 DIAGNOSIS — Z3A39 39 weeks gestation of pregnancy: Secondary | ICD-10-CM | POA: Diagnosis not present

## 2022-10-08 NOTE — Progress Notes (Unsigned)
   PRENATAL VISIT NOTE  Subjective:  Hayley Lawson is a 27 y.o. G4P3003 at [redacted]w[redacted]d being seen today for ongoing prenatal care.  She is currently monitored for the following issues for this high-risk pregnancy and has Migraines; ADHD; Pregnancy, supervision, high-risk, third trimester; History of gestational diabetes; GDM (gestational diabetes mellitus); and Viral URI on their problem list.  Patient reports  a little bit of swelling in feet.  Thought she might have broken her water so  .  Contractions: Irregular. Vag. Bleeding: None.  Movement: Present. Denies leaking of fluid.   The following portions of the patient's history were reviewed and updated as appropriate: allergies, current medications, past family history, past medical history, past social history, past surgical history and problem list.   Objective:   Vitals:   10/08/22 1552  BP: 126/89  Pulse: (!) 107  Weight: 198 lb 12.8 oz (90.2 kg)    Fetal Status: Fetal Heart Rate (bpm): 145 Fundal Height: 39 cm Movement: Present  Presentation: Vertex  General:  Alert, oriented and cooperative. Patient is in no acute distress.  Skin: Skin is warm and dry. No rash noted.   Cardiovascular: Normal heart rate noted  Respiratory: Normal respiratory effort, no problems with respiration noted  Abdomen: Soft, gravid, appropriate for gestational age.  Pain/Pressure: Present     Pelvic: Cervical exam performed in the presence of a chaperone Dilation: 4 Effacement (%): 30 Station: -3  Extremities: Normal range of motion.  Edema: Trace (feet)  Mental Status: Normal mood and affect. Normal behavior. Normal judgment and thought content.   Assessment and Plan:  Pregnancy: G4P3003 at [redacted]w[redacted]d 1. Pregnancy, supervision, high-risk, third trimester - on PNV - induction will be scheduled at 40 weeks.  Pt really would like water birth.  2. [redacted] weeks gestation of pregnancy  3. Gestational diabetes mellitus (GDM) in third trimester controlled on oral  hypoglycemic drug - diet controlled - blood sugars fasting < 105 and <120 2hr pp  4. Attention deficit hyperactivity disorder (ADHD), combined type - continued to take adderall in pregnancy.  Has been having growth scans.  Last one was 09/18/2022 at 2847gram, 50%-tile  Term labor symptoms and general obstetric precautions including but not limited to vaginal bleeding, contractions, leaking of fluid and fetal movement were reviewed in detail with the patient. Please refer to After Visit Summary for other counseling recommendations.   No follow-ups on file.  Future Appointments  Date Time Provider Department Center  10/15/2022  3:55 PM Jerene Bears, MD [facility]  10/22/2022  6:45 AM [facility] ROOM [facility] None    Jerene Bears, MD

## 2022-10-09 ENCOUNTER — Ambulatory Visit: Payer: Medicaid Other | Admitting: Physical Therapy

## 2022-10-10 ENCOUNTER — Encounter (HOSPITAL_BASED_OUTPATIENT_CLINIC_OR_DEPARTMENT_OTHER): Payer: Self-pay | Admitting: Obstetrics & Gynecology

## 2022-10-11 ENCOUNTER — Inpatient Hospital Stay (HOSPITAL_COMMUNITY)
Admission: AD | Admit: 2022-10-11 | Discharge: 2022-10-13 | DRG: 807 | Disposition: A | Discharge status: Home / Self Care | Payer: Medicaid Other | Attending: Obstetrics and Gynecology | Admitting: Obstetrics and Gynecology

## 2022-10-11 ENCOUNTER — Encounter (HOSPITAL_COMMUNITY): Payer: Self-pay | Admitting: Obstetrics and Gynecology

## 2022-10-11 DIAGNOSIS — Z3A39 39 weeks gestation of pregnancy: Secondary | ICD-10-CM | POA: Diagnosis not present

## 2022-10-11 DIAGNOSIS — O24420 Gestational diabetes mellitus in childbirth, diet controlled: Secondary | ICD-10-CM | POA: Diagnosis present

## 2022-10-11 DIAGNOSIS — Z87891 Personal history of nicotine dependence: Secondary | ICD-10-CM | POA: Diagnosis not present

## 2022-10-11 DIAGNOSIS — F53 Postpartum depression: Secondary | ICD-10-CM

## 2022-10-11 DIAGNOSIS — O26893 Other specified pregnancy related conditions, third trimester: Secondary | ICD-10-CM | POA: Diagnosis present

## 2022-10-11 DIAGNOSIS — O99820 Streptococcus B carrier state complicating pregnancy: Secondary | ICD-10-CM | POA: Diagnosis not present

## 2022-10-11 DIAGNOSIS — O99824 Streptococcus B carrier state complicating childbirth: Secondary | ICD-10-CM | POA: Diagnosis present

## 2022-10-11 DIAGNOSIS — Z8659 Personal history of other mental and behavioral disorders: Secondary | ICD-10-CM

## 2022-10-11 DIAGNOSIS — O24415 Gestational diabetes mellitus in pregnancy, controlled by oral hypoglycemic drugs: Secondary | ICD-10-CM

## 2022-10-11 DIAGNOSIS — O99344 Other mental disorders complicating childbirth: Secondary | ICD-10-CM | POA: Diagnosis not present

## 2022-10-11 DIAGNOSIS — R030 Elevated blood-pressure reading, without diagnosis of hypertension: Secondary | ICD-10-CM | POA: Diagnosis not present

## 2022-10-11 LAB — TYPE AND SCREEN
ABO/RH(D): O POS
Antibody Screen: NEGATIVE

## 2022-10-11 LAB — CBC
HCT: 37.4 % (ref 36.0–46.0)
Hemoglobin: 12.4 g/dL (ref 12.0–15.0)
MCH: 30.8 pg (ref 26.0–34.0)
MCHC: 33.2 g/dL (ref 30.0–36.0)
MCV: 92.8 fL (ref 80.0–100.0)
Platelets: 281 10*3/uL (ref 150–400)
RBC: 4.03 MIL/uL (ref 3.87–5.11)
RDW: 13.2 % (ref 11.5–15.5)
WBC: 7.3 10*3/uL (ref 4.0–10.5)
nRBC: 0 % (ref 0.0–0.2)

## 2022-10-11 LAB — GLUCOSE, CAPILLARY: Glucose-Capillary: 121 mg/dL — ABNORMAL HIGH (ref 70–99)

## 2022-10-11 LAB — RPR: RPR Ser Ql: NONREACTIVE

## 2022-10-11 MED ORDER — ONDANSETRON HCL 4 MG/2ML IJ SOLN
4.0000 mg | INTRAMUSCULAR | Status: DC | PRN
  Filled 2022-10-11: qty 2

## 2022-10-11 MED ORDER — DIPHENHYDRAMINE HCL 25 MG PO CAPS: 25.0000 mg | ORAL_CAPSULE | Freq: Four times a day (QID) | ORAL | Status: DC | PRN

## 2022-10-11 MED ORDER — LIDOCAINE HCL (PF) 1 % IJ SOLN: 30.0000 mL | INTRAMUSCULAR | Status: DC | PRN

## 2022-10-11 MED ORDER — LACTATED RINGERS IV SOLN: INTRAVENOUS | Status: DC

## 2022-10-11 MED ORDER — BENZOCAINE-MENTHOL 20-0.5 % EX AERO
1.0000 | INHALATION_SPRAY | CUTANEOUS | Status: DC | PRN
  Administered 2022-10-12: 1 via TOPICAL
  Filled 2022-10-11: qty 56

## 2022-10-11 MED ORDER — TETANUS-DIPHTH-ACELL PERTUSSIS 5-2.5-18.5 LF-MCG/0.5 IM SUSY: 0.5000 mL | PREFILLED_SYRINGE | Freq: Once | INTRAMUSCULAR | Status: DC

## 2022-10-11 MED ORDER — SENNOSIDES-DOCUSATE SODIUM 8.6-50 MG PO TABS
2.0000 | ORAL_TABLET | ORAL | Status: DC
  Administered 2022-10-11 – 2022-10-13 (×3): 2 via ORAL
  Filled 2022-10-11 (×3): qty 2

## 2022-10-11 MED ORDER — OXYCODONE-ACETAMINOPHEN 5-325 MG PO TABS: 2.0000 | ORAL_TABLET | ORAL | Status: DC | PRN

## 2022-10-11 MED ORDER — IBUPROFEN 600 MG PO TABS
600.0000 mg | ORAL_TABLET | Freq: Four times a day (QID) | ORAL | Status: DC
  Administered 2022-10-11 – 2022-10-13 (×10): 600 mg via ORAL
  Filled 2022-10-11 (×10): qty 1

## 2022-10-11 MED ORDER — METHYLERGONOVINE MALEATE 0.2 MG PO TABS: 0.2000 mg | ORAL_TABLET | ORAL | Status: DC | PRN

## 2022-10-11 MED ORDER — WITCH HAZEL-GLYCERIN EX PADS: 1.0000 | MEDICATED_PAD | CUTANEOUS | Status: DC | PRN

## 2022-10-11 MED ORDER — COCONUT OIL OIL: 1.0000 | TOPICAL_OIL | Status: DC | PRN

## 2022-10-11 MED ORDER — FLEET ENEMA 7-19 GM/118ML RE ENEM: 1.0000 | ENEMA | RECTAL | Status: DC | PRN

## 2022-10-11 MED ORDER — PRENATAL MULTIVITAMIN CH
1.0000 | ORAL_TABLET | Freq: Every day | ORAL | Status: DC
  Administered 2022-10-11: 1 via ORAL
  Filled 2022-10-11 (×2): qty 1

## 2022-10-11 MED ORDER — LIDOCAINE HCL (PF) 1 % IJ SOLN
INTRAMUSCULAR | Status: AC
  Administered 2022-10-11: 30 mL
  Filled 2022-10-11: qty 30

## 2022-10-11 MED ORDER — OXYCODONE-ACETAMINOPHEN 5-325 MG PO TABS
1.0000 | ORAL_TABLET | ORAL | Status: DC | PRN
  Administered 2022-10-11: 1 via ORAL
  Filled 2022-10-11: qty 1

## 2022-10-11 MED ORDER — DIBUCAINE (PERIANAL) 1 % EX OINT: 1.0000 | TOPICAL_OINTMENT | CUTANEOUS | Status: DC | PRN

## 2022-10-11 MED ORDER — OXYTOCIN 10 UNIT/ML IJ SOLN
INTRAMUSCULAR | Status: AC
  Filled 2022-10-11: qty 1

## 2022-10-11 MED ORDER — OXYCODONE HCL 5 MG PO TABS
5.0000 mg | ORAL_TABLET | Freq: Once | ORAL | Status: AC
  Administered 2022-10-11: 5 mg via ORAL
  Filled 2022-10-11: qty 1

## 2022-10-11 MED ORDER — ONDANSETRON HCL 4 MG/2ML IJ SOLN: 4.0000 mg | Freq: Four times a day (QID) | INTRAMUSCULAR | Status: DC | PRN

## 2022-10-11 MED ORDER — LACTATED RINGERS IV SOLN: 500.0000 mL | INTRAVENOUS | Status: DC | PRN

## 2022-10-11 MED ORDER — OXYTOCIN BOLUS FROM INFUSION: 333.0000 mL | Freq: Once | INTRAVENOUS | Status: DC

## 2022-10-11 MED ORDER — ZOLPIDEM TARTRATE 5 MG PO TABS: 5.0000 mg | ORAL_TABLET | Freq: Every evening | ORAL | Status: DC | PRN

## 2022-10-11 MED ORDER — SOD CITRATE-CITRIC ACID 500-334 MG/5ML PO SOLN: 30.0000 mL | ORAL | Status: DC | PRN

## 2022-10-11 MED ORDER — METHYLERGONOVINE MALEATE 0.2 MG/ML IJ SOLN: 0.2000 mg | INTRAMUSCULAR | Status: DC | PRN

## 2022-10-11 MED ORDER — OXYTOCIN-SODIUM CHLORIDE 30-0.9 UT/500ML-% IV SOLN: 2.5000 [IU]/h | INTRAVENOUS | Status: DC

## 2022-10-11 MED ORDER — OXYTOCIN-SODIUM CHLORIDE 30-0.9 UT/500ML-% IV SOLN
INTRAVENOUS | Status: AC
  Filled 2022-10-11: qty 500

## 2022-10-11 MED ORDER — SODIUM CHLORIDE 0.9 % IV SOLN
2.0000 g | Freq: Four times a day (QID) | INTRAVENOUS | Status: DC
  Administered 2022-10-11: 2 g via INTRAVENOUS
  Filled 2022-10-11: qty 2000

## 2022-10-11 MED ORDER — ACETAMINOPHEN 325 MG PO TABS: 650.0000 mg | ORAL_TABLET | ORAL | Status: DC | PRN

## 2022-10-11 MED ORDER — ACETAMINOPHEN 325 MG PO TABS
650.0000 mg | ORAL_TABLET | ORAL | Status: DC | PRN
  Administered 2022-10-11 – 2022-10-12 (×3): 650 mg via ORAL
  Filled 2022-10-11 (×3): qty 2

## 2022-10-11 MED ORDER — ONDANSETRON HCL 4 MG PO TABS
4.0000 mg | ORAL_TABLET | ORAL | Status: DC | PRN
  Administered 2022-10-11: 4 mg via ORAL
  Filled 2022-10-11: qty 1

## 2022-10-11 MED ORDER — SIMETHICONE 80 MG PO CHEW: 80.0000 mg | CHEWABLE_TABLET | ORAL | Status: DC | PRN

## 2022-10-11 NOTE — MAU Note (Signed)
.  Hayley Lawson is a 27 y.o. at [redacted]w[redacted]d here in MAU reporting:   Contractions every: 2-4 minutes Onset of ctx: Today Pain score: 6/10  ROM: Intact Vaginal Bleeding: None   Epidural: Planning Waterbirth  Fetal Movement: Reports positive FM    OB Office: Faculty GBS: Negative HSV: Denies hx of HSV Lab orders placed from triage: MAU Labor Eval

## 2022-10-11 NOTE — Discharge Summary (Signed)
Postpartum Discharge Summary     Patient Name: Hayley Lawson DOB: Nov 05, 1995 MRN: [id number]  Date of admission: 10/11/2022 Delivery date:10/11/2022  Delivering provider: Aviva Signs  Date of discharge: 10/12/2022  Admitting diagnosis: Normal labor and delivery [O80] Intrauterine pregnancy: [redacted]w[redacted]d     Secondary diagnosis:  Principal Problem:   Normal labor and delivery  Additional problems: Water for Labor, delivered in bed    Discharge diagnosis: Term Pregnancy Delivered and GDM A1                                              Post partum procedures: none Augmentation: N/A Complications: None  Hospital course: Onset of Labor With Vaginal Delivery      27 y.o. yo [id number] at [redacted]w[redacted]d was admitted in Active Labor on 10/11/2022. Labor course was complicated by nothing.  She was 8cm on arrival and tub was filled   She labored well there but requested to get out just before birth.    Membrane Rupture Time/Date: 3:15 AM ,10/11/2022   Delivery Method:Vaginal, Spontaneous  Episiotomy: None  Lacerations:  1st degree  Patient had a postpartum course complicated by GBS with only one dose of Ampicillin given due to rapid labor   She labored in the water but got out just before birth.  She is ambulating, tolerating a regular diet, passing flatus, and urinating well. Patient is discharged home in stable condition on 10/12/22.  Newborn Data: Birth date:10/11/2022  Birth time:3:18 AM  Gender:Female  Living status:Living  Apgars:9 ,9  Weight:3430 g   Magnesium Sulfate received: No BMZ received: No Rhophylac:N/A MMR:No T-DaP:Given prenatally Flu: No Transfusion:No  Physical exam  Vitals:   10/11/22 1440 10/11/22 1635 10/11/22 2146 10/12/22 0607  BP: 114/76 128/83 126/84 115/88  Pulse: 67 84 72 76  Resp:  16 16 16   Temp:   97.8 F (36.6 C)   TempSrc:   Oral   SpO2: 98% 99% 99% 98%  Weight:      Height:       General: alert, cooperative, and no distress Lochia:  appropriate Uterine Fundus: firm Incision: N/A DVT Evaluation: No evidence of DVT seen on physical exam. Labs: Lab Results  Component Value Date   WBC 7.3 10/11/2022   HGB 12.4 10/11/2022   HCT 37.4 10/11/2022   MCV 92.8 10/11/2022   PLT 281 10/11/2022      Latest Ref Rng & Units 11/08/2021   10:23 AM  CMP  Glucose 70 - 99 mg/dL 82   BUN 6 - 20 mg/dL 14   Creatinine 1.47 - 1.00 mg/dL 8.29   Sodium 562 - 130 mmol/L 136   Potassium 3.5 - 5.2 mmol/L 4.7   Chloride 96 - 106 mmol/L 101   CO2 20 - 29 mmol/L 18   Calcium 8.7 - 10.2 mg/dL 9.7   Total Protein 6.0 - 8.5 g/dL 7.3   Total Bilirubin 0.0 - 1.2 mg/dL 0.6   Alkaline Phos 44 - 121 IU/L 57   AST 0 - 40 IU/L 21   ALT 0 - 32 IU/L 32    Edinburgh Score:    10/12/2022    6:03 AM  Edinburgh Postnatal Depression Scale Screening Tool  I have been able to laugh and see the funny side of things. 1  I have looked forward with enjoyment to things. 1  I  have blamed myself unnecessarily when things went wrong. 1  I have been anxious or worried for no good reason. 2  I have felt scared or panicky for no good reason. 2  Things have been getting on top of me. 2  I have been so unhappy that I have had difficulty sleeping. 2  I have felt sad or miserable. 1  I have been so unhappy that I have been crying. 1  The thought of harming myself has occurred to me. 0  Edinburgh Postnatal Depression Scale Total 13      After visit meds:  Allergies as of 10/12/2022       Reactions   Bactrim [sulfamethoxazole-trimethoprim] Rash   Doxycycline Rash        Medication List     STOP taking these medications    Accu-Chek Softclix Lancets lancets   aspirin EC 81 MG tablet   blood glucose meter kit and supplies Kit       TAKE these medications    albuterol 108 (90 Base) MCG/ACT inhaler Commonly known as: VENTOLIN HFA Inhale 2 puffs into the lungs every 6 (six) hours as needed for wheezing or shortness of breath.    amphetamine-dextroamphetamine 20 MG 24 hr capsule Commonly known as: ADDERALL XR Take 1 capsule (20 mg total) by mouth every morning.   ibuprofen 600 MG tablet Commonly known as: ADVIL Take 1 tablet (600 mg total) by mouth every 6 (six) hours.   prenatal multivitamin Tabs tablet Take 1 tablet by mouth daily at 12 noon.   promethazine 25 MG tablet Commonly known as: PHENERGAN Take 1 tablet (25 mg total) by mouth every 6 (six) hours as needed for nausea or vomiting.         Discharge home in stable condition Infant Feeding: Breast Infant Disposition:home with mother Discharge instruction: per After Visit Summary and Postpartum booklet. Activity: Advance as tolerated. Pelvic rest for 6 weeks.  Diet: routine diet Anticipated Birth Control: IUD Postpartum Appointment:4 weeks Additional Postpartum F/U:  Paragard IUD insertion Future Appointments: Future Appointments  Date Time Provider Department Center  11/20/2022  2:15 PM Leftwich-Kirby, Wilmer Floor, CNM [facility]   Follow up Visit:  Follow-up Information     [facility] for [facility] at [facility]. Schedule an appointment as soon as possible for a visit in 4 week(s).   Specialty: Obstetrics and Gynecology Contact information: [address] [place] [address] [phone]                    10/12/2022 Wynelle Bourgeois, CNM

## 2022-10-11 NOTE — Progress Notes (Signed)
Pt ambulated to bathroom with standby assist. After standing in restroom  pt complains of nausea and feeling dizzy. Pt advised to sit down on toilet. Pt vomited x 1. Pt steady back to bed. Pt bleeding small, fundus firm,  and vital signs WNL. Will continue to monitor pt.

## 2022-10-12 MED ORDER — IBUPROFEN 600 MG PO TABS: 600.0000 mg | ORAL_TABLET | Freq: Four times a day (QID) | ORAL | 0 refills | Status: DC

## 2022-10-12 NOTE — Lactation Note (Signed)
This note was copied from a baby's chart. Lactation Consultation Note  Patient Name: Hayley Lawson [id number] Date: 10/12/2022 Age:28 hours  Mom chooses to formula feed.   Maternal Data    Feeding Nipple Type: Slow - flow  LATCH Score                    Lactation Tools Discussed/Used    Interventions    Discharge    Consult Status Consult Status: Complete    Zaidy Absher G 10/12/2022, 7:50 PM

## 2022-10-13 ENCOUNTER — Other Ambulatory Visit: Payer: Self-pay

## 2022-10-13 DIAGNOSIS — R030 Elevated blood-pressure reading, without diagnosis of hypertension: Secondary | ICD-10-CM | POA: Diagnosis not present

## 2022-10-13 DIAGNOSIS — Z8659 Personal history of other mental and behavioral disorders: Secondary | ICD-10-CM

## 2022-10-13 MED ORDER — FUROSEMIDE 20 MG PO TABS: 20.0000 mg | ORAL_TABLET | Freq: Every day | ORAL | 0 refills | Status: DC

## 2022-10-13 NOTE — Progress Notes (Addendum)
CSW received consult for diagnosis of ADHD and Edinburgh Postnatal Depression Scale score of 13. CSW met with [name] to offer support and complete assessment. When CSW entered room, [name] was observed sitting in hospital bed. Infant was sleeping on back in bassinet. FOB was present asleep on couch. CSW introduced self and requested to speak with [name] alone. [name] provided verbal consent to complete consult with FOB present.   CSW inquired how [name] is feeling emotionally since delivery. [name] reports she is feeling "okay." [name] shared that she has been feeling worried about postpartum depression, explaining that "(she) had it bad" with her 3rd child who is now almost 6 years old. [name] states that she is feeling worried about going home, stating that she will be alone.  CSW inquired about past postpartum depression symptoms. [name] reports she endorsed "negative thoughts" after the birth of her daughter (now almost 6 years old), marked by endorsing thoughts about harming her daughter which triggered her to endorse thoughts about wanting to harm herself instead. [name] states she did not come up with a plan and did not act on these thoughts. [name] states she endorsed thoughts of harming her daughter and herself for the first 6 months postpartum and recalls endorsing symptoms of depression and anxiety for 2 years postpartum. [name] reports she tried taking amitriptyline in the past but she felt that the medication made her feel numb without relief so she discontinued the medication. [name] explained that she is currently prescribed Adderall for management of ADHD symptoms and she does not want to take additional medications at this time. [name] states she did attend therapy in 2020 which she recalls was helpful. [name] identified reading about postpartum depression and meditating as helpful coping skills when she was having symptoms of postpartum depression in the past. [name] identified her mother--in-law as a support, sharing that her MIL is not  emotionally available; however, she lives next door and it is helpful to have someone to talk to and help care for her children. [name] also identified her sister who is coming to visit from out of town and a friend as a support who she can discuss mental health concerns with, sharing that her friend checks in on [name] frequently. [name] denies endorsing thoughts of harming herself or infant since infant's birth. [name] denies HI and domestic violence.   [name] reports that due to her concerns about postpartum depression, she has scheduled an initial therapy appointment with [facility] 11/05/2022. CSW commended [name] for arranging mental health support postpartum. [name] was agreeable to additional outpatient mental health resources, which CSW provided. CSW also provided information about Behavioral Health Urgent Care crisis resource.   [name] reports that she has met with Integrated Behavioral Health therapist, Jamie through [facility] during her pregnancy, which she identified as a helpful support. [name] expressed interest in a referral to meet with Jamie postpartum.   CSW notified [name] of [name]'s history of postpartum depression and history of thoughts of self harm/harming her daughter as a newborn for close postpartum mood follow up. Per [name]'s provider, a referral was placed for Integrated Behavioral Health and a mood check screen was scheduled to occur during [name]'s upcoming blood pressure appointment 1 week postpartum. CSW provided information about [facility] nurse home visiting program and offered to place a referral, [name] provided verbal consent for CSW to do so.  CSW provided education regarding the baby blues period vs. perinatal mood disorders, discussed treatment and gave resources   for mental health follow up if concerns arise.  CSW recommends self-evaluation during the postpartum time period using the New Mom Checklist from Postpartum Progress and encouraged [name] to  contact a medical professional if symptoms are noted at any time.    [name] reports she has all needed items for infant, including a car seat and bassinet. [name] has chosen [facility] for infant's follow up care.  CSW provided review of Sudden Infant Death Syndrome (SIDS) precautions.    CSW identifies no further need for intervention and no barriers to discharge at this time.  Signed,  Johan Creveling K. Larose Batres, MSW, LCSWA, LCASA 10/13/2022 6:20 PM 

## 2022-10-13 NOTE — Plan of Care (Signed)
Problem: Education: Goal: Knowledge of General Education information will improve Description: Including pain rating scale, medication(s)/side effects and non-pharmacologic comfort measures 10/13/2022 0833 by Donne Hazel, LPN Outcome: Adequate for Discharge 10/13/2022 0831 by Donne Hazel, LPN Outcome: Progressing   Problem: Health Behavior/Discharge Planning: Goal: Ability to manage health-related needs will improve 10/13/2022 0833 by Donne Hazel, LPN Outcome: Adequate for Discharge 10/13/2022 0831 by Donne Hazel, LPN Outcome: Progressing   Problem: Clinical Measurements: Goal: Ability to maintain clinical measurements within normal limits will improve 10/13/2022 0833 by Donne Hazel, LPN Outcome: Adequate for Discharge 10/13/2022 0831 by Donne Hazel, LPN Outcome: Progressing Goal: Will remain free from infection 10/13/2022 0833 by Donne Hazel, LPN Outcome: Adequate for Discharge 10/13/2022 0831 by Donne Hazel, LPN Outcome: Progressing Goal: Diagnostic test results will improve 10/13/2022 0833 by Donne Hazel, LPN Outcome: Adequate for Discharge 10/13/2022 0831 by Donne Hazel, LPN Outcome: Progressing Goal: Respiratory complications will improve 10/13/2022 0833 by Donne Hazel, LPN Outcome: Adequate for Discharge 10/13/2022 0831 by Donne Hazel, LPN Outcome: Progressing Goal: Cardiovascular complication will be avoided 10/13/2022 0833 by Donne Hazel, LPN Outcome: Adequate for Discharge 10/13/2022 0831 by Donne Hazel, LPN Outcome: Progressing   Problem: Activity: Goal: Risk for activity intolerance will decrease 10/13/2022 0833 by Donne Hazel, LPN Outcome: Adequate for Discharge 10/13/2022 0831 by Donne Hazel, LPN Outcome: Progressing   Problem: Nutrition: Goal: Adequate nutrition will be maintained 10/13/2022 0833 by Donne Hazel, LPN Outcome: Adequate for Discharge 10/13/2022 0831 by Donne Hazel, LPN Outcome: Progressing    Problem: Coping: Goal: Level of anxiety will decrease 10/13/2022 0833 by Donne Hazel, LPN Outcome: Adequate for Discharge 10/13/2022 0831 by Donne Hazel, LPN Outcome: Progressing   Problem: Elimination: Goal: Will not experience complications related to bowel motility 10/13/2022 0833 by Donne Hazel, LPN Outcome: Adequate for Discharge 10/13/2022 0831 by Donne Hazel, LPN Outcome: Progressing Goal: Will not experience complications related to urinary retention 10/13/2022 0833 by Donne Hazel, LPN Outcome: Adequate for Discharge 10/13/2022 0831 by Donne Hazel, LPN Outcome: Progressing   Problem: Pain Managment: Goal: General experience of comfort will improve 10/13/2022 0833 by Donne Hazel, LPN Outcome: Adequate for Discharge 10/13/2022 0831 by Donne Hazel, LPN Outcome: Progressing   Problem: Safety: Goal: Ability to remain free from injury will improve 10/13/2022 0833 by Donne Hazel, LPN Outcome: Adequate for Discharge 10/13/2022 0831 by Donne Hazel, LPN Outcome: Progressing   Problem: Skin Integrity: Goal: Risk for impaired skin integrity will decrease 10/13/2022 0833 by Donne Hazel, LPN Outcome: Adequate for Discharge 10/13/2022 0831 by Donne Hazel, LPN Outcome: Progressing   Problem: Education: Goal: Knowledge of Childbirth will improve 10/13/2022 0833 by Donne Hazel, LPN Outcome: Adequate for Discharge 10/13/2022 0831 by Donne Hazel, LPN Outcome: Progressing Goal: Ability to make informed decisions regarding treatment and plan of care will improve 10/13/2022 0833 by Donne Hazel, LPN Outcome: Adequate for Discharge 10/13/2022 0831 by Donne Hazel, LPN Outcome: Progressing Goal: Ability to state and carry out methods to decrease the pain will improve 10/13/2022 0833 by Donne Hazel, LPN Outcome: Adequate for Discharge 10/13/2022 0831 by Donne Hazel, LPN Outcome: Progressing Goal: Individualized Educational Video(s) 10/13/2022  0833 by Donne Hazel, LPN Outcome: Adequate for Discharge 10/13/2022 0831 by Donne Hazel, LPN Outcome: Progressing   Problem: Coping: Goal: Ability to verbalize concerns and feelings  about labor and delivery will improve 10/13/2022 0833 by Donne Hazel, LPN Outcome: Adequate for Discharge 10/13/2022 0831 by Donne Hazel, LPN Outcome: Progressing   Problem: Life Cycle: Goal: Ability to make normal progression through stages of labor will improve 10/13/2022 0833 by Donne Hazel, LPN Outcome: Adequate for Discharge 10/13/2022 0831 by Donne Hazel, LPN Outcome: Progressing Goal: Ability to effectively push during vaginal delivery will improve 10/13/2022 0833 by Donne Hazel, LPN Outcome: Adequate for Discharge 10/13/2022 0831 by Donne Hazel, LPN Outcome: Progressing   Problem: Role Relationship: Goal: Will demonstrate positive interactions with the child 10/13/2022 0833 by Donne Hazel, LPN Outcome: Adequate for Discharge 10/13/2022 0831 by Donne Hazel, LPN Outcome: Progressing   Problem: Safety: Goal: Risk of complications during labor and delivery will decrease 10/13/2022 0833 by Donne Hazel, LPN Outcome: Adequate for Discharge 10/13/2022 0831 by Donne Hazel, LPN Outcome: Progressing   Problem: Pain Management: Goal: Relief or control of pain from uterine contractions will improve 10/13/2022 0833 by Donne Hazel, LPN Outcome: Adequate for Discharge 10/13/2022 0831 by Donne Hazel, LPN Outcome: Progressing   Problem: Coping: Goal: Ability to identify and utilize available resources and services will improve 10/13/2022 0833 by Donne Hazel, LPN Outcome: Adequate for Discharge 10/13/2022 0831 by Donne Hazel, LPN Outcome: Progressing   Problem: Life Cycle: Goal: Chance of risk for complications during the postpartum period will decrease 10/13/2022 0833 by Donne Hazel, LPN Outcome: Adequate for Discharge 10/13/2022 0831 by Donne Hazel,  LPN Outcome: Progressing   Problem: Role Relationship: Goal: Ability to demonstrate positive interaction with newborn will improve 10/13/2022 0833 by Donne Hazel, LPN Outcome: Adequate for Discharge 10/13/2022 0831 by Donne Hazel, LPN Outcome: Progressing   Problem: Skin Integrity: Goal: Demonstration of wound healing without infection will improve 10/13/2022 0833 by Donne Hazel, LPN Outcome: Adequate for Discharge 10/13/2022 0831 by Donne Hazel, LPN Outcome: Progressing

## 2022-10-13 NOTE — Discharge Summary (Signed)
Postpartum Discharge Summary     Patient Name: Hayley Lawson DOB: 04/28/1996 MRN: [id number]  Date of admission: 10/11/2022 Delivery date:10/11/2022  Delivering provider: Aviva Signs  Date of discharge: 10/13/2022  Admitting diagnosis: Normal labor and delivery [O80] Intrauterine pregnancy: [redacted]w[redacted]d     Secondary diagnosis:  Principal Problem:   Normal labor and delivery  Additional problems: Water for Labor, delivered in bed    Discharge diagnosis: Term Pregnancy Delivered and GDM A1                                              Post partum procedures: none Augmentation: N/A Complications: None  Hospital course: Onset of Labor With Vaginal Delivery      27 y.o. yo [id number] at [redacted]w[redacted]d was admitted in Active Labor on 10/11/2022. Labor course was complicated by nothing.  She was 8cm on arrival and tub was filled   She labored well there but requested to get out just before birth.    Membrane Rupture Time/Date: 3:15 AM ,10/11/2022   Delivery Method:Vaginal, Spontaneous  Episiotomy: None  Lacerations:  1st degree  Patient had a postpartum course complicated by GBS with only one dose of Ampicillin given due to rapid labor   She labored in the water but got out just before birth.  She is ambulating, tolerating a regular diet, passing flatus, and urinating well. Patient is discharged home in stable condition on 10/13/22.  Newborn Data: Birth date:10/11/2022  Birth time:3:18 AM  Gender:Female  Living status:Living  Apgars:9 ,9  Weight:3430 g   Magnesium Sulfate received: No BMZ received: No Rhophylac:N/A MMR:No T-DaP:Given prenatally Flu: No Transfusion:No  Physical exam  Vitals:   10/12/22 1851 10/12/22 1940 10/13/22 0516 10/13/22 0730  BP: (!) 129/94 123/80 136/85 117/81  Pulse: 95 88 82   Resp: 18 18 17    Temp: 99 F (37.2 C) 98.7 F (37.1 C) 98 F (36.7 C)   TempSrc: Oral Oral Oral   SpO2: 99% 100% 99%   Weight:      Height:       General: alert, cooperative,  and no distress Lochia: appropriate Uterine Fundus: firm Incision: N/A DVT Evaluation: No evidence of DVT seen on physical exam. Labs: Lab Results  Component Value Date   WBC 7.3 10/11/2022   HGB 12.4 10/11/2022   HCT 37.4 10/11/2022   MCV 92.8 10/11/2022   PLT 281 10/11/2022      Latest Ref Rng & Units 11/08/2021   10:23 AM  CMP  Glucose 70 - 99 mg/dL 82   BUN 6 - 20 mg/dL 14   Creatinine 2.84 - 1.00 mg/dL 1.32   Sodium 440 - 102 mmol/L 136   Potassium 3.5 - 5.2 mmol/L 4.7   Chloride 96 - 106 mmol/L 101   CO2 20 - 29 mmol/L 18   Calcium 8.7 - 10.2 mg/dL 9.7   Total Protein 6.0 - 8.5 g/dL 7.3   Total Bilirubin 0.0 - 1.2 mg/dL 0.6   Alkaline Phos 44 - 121 IU/L 57   AST 0 - 40 IU/L 21   ALT 0 - 32 IU/L 32    Edinburgh Score:    10/12/2022    6:03 AM  Edinburgh Postnatal Depression Scale Screening Tool  I have been able to laugh and see the funny side of things. 1  I have looked forward  with enjoyment to things. 1  I have blamed myself unnecessarily when things went wrong. 1  I have been anxious or worried for no good reason. 2  I have felt scared or panicky for no good reason. 2  Things have been getting on top of me. 2  I have been so unhappy that I have had difficulty sleeping. 2  I have felt sad or miserable. 1  I have been so unhappy that I have been crying. 1  The thought of harming myself has occurred to me. 0  Edinburgh Postnatal Depression Scale Total 13      After visit meds:  Allergies as of 10/13/2022       Reactions   Bactrim [sulfamethoxazole-trimethoprim] Rash   Doxycycline Rash        Medication List     STOP taking these medications    Accu-Chek Softclix Lancets lancets   aspirin EC 81 MG tablet   blood glucose meter kit and supplies Kit       TAKE these medications    albuterol 108 (90 Base) MCG/ACT inhaler Commonly known as: VENTOLIN HFA Inhale 2 puffs into the lungs every 6 (six) hours as needed for wheezing or shortness of  breath.   amphetamine-dextroamphetamine 20 MG 24 hr capsule Commonly known as: ADDERALL XR Take 1 capsule (20 mg total) by mouth every morning.   ibuprofen 600 MG tablet Commonly known as: ADVIL Take 1 tablet (600 mg total) by mouth every 6 (six) hours.   prenatal multivitamin Tabs tablet Take 1 tablet by mouth daily at 12 noon.   promethazine 25 MG tablet Commonly known as: PHENERGAN Take 1 tablet (25 mg total) by mouth every 6 (six) hours as needed for nausea or vomiting.         Discharge home in stable condition Infant Feeding: Breast Infant Disposition:home with mother Discharge instruction: per After Visit Summary and Postpartum booklet. Activity: Advance as tolerated. Pelvic rest for 6 weeks.  Diet: routine diet Anticipated Birth Control: IUD Postpartum Appointment:4 weeks Additional Postpartum F/U:  Paragard IUD insertion Future Appointments: Future Appointments  Date Time Provider Department Center  11/20/2022  2:15 PM Leftwich-Kirby, Wilmer Floor, CNM [facility]   Follow up Visit:  Follow-up Information     [facility] at [facility]. Schedule an appointment as soon as possible for a visit in 4 week(s).   Specialty: Obstetrics and Gynecology Contact information: [address] [place] [address] [phone]                    10/13/2022 Celedonio Savage, MD

## 2022-10-13 NOTE — Plan of Care (Signed)
  Problem: Education: Goal: Knowledge of General Education information will improve Description: Including pain rating scale, medication(s)/side effects and non-pharmacologic comfort measures Outcome: Progressing   Problem: Health Behavior/Discharge Planning: Goal: Ability to manage health-related needs will improve Outcome: Progressing   Problem: Clinical Measurements: Goal: Ability to maintain clinical measurements within normal limits will improve Outcome: Progressing Goal: Will remain free from infection Outcome: Progressing Goal: Diagnostic test results will improve Outcome: Progressing Goal: Respiratory complications will improve Outcome: Progressing Goal: Cardiovascular complication will be avoided Outcome: Progressing   Problem: Activity: Goal: Risk for activity intolerance will decrease Outcome: Progressing   Problem: Nutrition: Goal: Adequate nutrition will be maintained Outcome: Progressing   Problem: Coping: Goal: Level of anxiety will decrease Outcome: Progressing   Problem: Elimination: Goal: Will not experience complications related to bowel motility Outcome: Progressing Goal: Will not experience complications related to urinary retention Outcome: Progressing   Problem: Pain Managment: Goal: General experience of comfort will improve Outcome: Progressing   Problem: Safety: Goal: Ability to remain free from injury will improve Outcome: Progressing   Problem: Skin Integrity: Goal: Risk for impaired skin integrity will decrease Outcome: Progressing   Problem: Education: Goal: Knowledge of Childbirth will improve Outcome: Progressing Goal: Ability to make informed decisions regarding treatment and plan of care will improve Outcome: Progressing Goal: Ability to state and carry out methods to decrease the pain will improve Outcome: Progressing Goal: Individualized Educational Video(s) Outcome: Progressing   Problem: Coping: Goal: Ability to  verbalize concerns and feelings about labor and delivery will improve Outcome: Progressing   Problem: Life Cycle: Goal: Ability to make normal progression through stages of labor will improve Outcome: Progressing Goal: Ability to effectively push during vaginal delivery will improve Outcome: Progressing   Problem: Role Relationship: Goal: Will demonstrate positive interactions with the child Outcome: Progressing   Problem: Safety: Goal: Risk of complications during labor and delivery will decrease Outcome: Progressing   Problem: Pain Management: Goal: Relief or control of pain from uterine contractions will improve Outcome: Progressing   Problem: Coping: Goal: Ability to identify and utilize available resources and services will improve Outcome: Progressing   Problem: Life Cycle: Goal: Chance of risk for complications during the postpartum period will decrease Outcome: Progressing   Problem: Role Relationship: Goal: Ability to demonstrate positive interaction with newborn will improve Outcome: Progressing   Problem: Skin Integrity: Goal: Demonstration of wound healing without infection will improve Outcome: Progressing

## 2022-10-15 ENCOUNTER — Encounter (HOSPITAL_BASED_OUTPATIENT_CLINIC_OR_DEPARTMENT_OTHER): Payer: Self-pay | Admitting: Obstetrics & Gynecology

## 2022-10-15 ENCOUNTER — Inpatient Hospital Stay (HOSPITAL_COMMUNITY): Admission: AD | Admit: 2022-10-15 | Payer: Medicaid Other | Source: Home / Self Care | Admitting: Family Medicine

## 2022-10-15 ENCOUNTER — Inpatient Hospital Stay (HOSPITAL_COMMUNITY): Payer: Medicaid Other

## 2022-10-15 DIAGNOSIS — B951 Streptococcus, group B, as the cause of diseases classified elsewhere: Secondary | ICD-10-CM | POA: Insufficient documentation

## 2022-10-15 HISTORY — DX: Streptococcus, group b, as the cause of diseases classified elsewhere: B95.1

## 2022-10-16 ENCOUNTER — Telehealth (HOSPITAL_COMMUNITY): Payer: Self-pay | Admitting: *Deleted

## 2022-10-16 DIAGNOSIS — Z1331 Encounter for screening for depression: Secondary | ICD-10-CM

## 2022-10-16 NOTE — Telephone Encounter (Signed)
Hospital inpatient EPDS=13. CSW consult completed during stay. Ambulatory IBH referral made now. Dr. Jolayne Panther notified via chart. Paulene Floor, RN, 10/16/22, 2018

## 2022-10-18 ENCOUNTER — Telehealth: Payer: Self-pay | Admitting: Clinical

## 2022-10-18 NOTE — Telephone Encounter (Signed)
Attempt call regarding referral; Left HIPPA-compliant message to call back Nechelle Petrizzo from [facility] at  [phone] (Monia Timmers's office).   

## 2022-10-18 NOTE — H&P (Signed)
Hayley Lawson is a 27 y.o. female presenting for Painful contractions since  earlier.  Denies leaking or bleeding and reports positive fetal movement Gets prenatal care at [facility]. Patient Active Problem List   Diagnosis Date Noted   Positive GBS test 10/15/2022   Elevated blood pressure reading 10/13/2022   History of postpartum depression 10/13/2022   Normal labor and delivery 10/11/2022   Viral URI 09/10/2022   GDM (gestational diabetes mellitus) 07/13/2022   History of gestational diabetes 06/13/2022   Pregnancy, supervision, high-risk, third trimester 03/12/2022   Migraines 09/05/2021   ADHD 09/05/2021      RN Note: Contractions every: 2-4 minutes Onset of ctx: Today Pain score: 6/10  ROM: Intact Vaginal Bleeding: None               Planning Waterbirth . OB History     Gravida  4   Para  4   Term  4   Preterm      AB      Living  4      SAB      IAB      Ectopic      Multiple  0   Live Births  4          Past Medical History:  Diagnosis Date   ADHD    Gestational diabetes    Past Surgical History:  Procedure Laterality Date   CHOLECYSTECTOMY  2019   UMBILICAL HERNIA REPAIR  2001   Family History: family history includes Anxiety disorder in her father; Depression in her father; Diabetes in her father; Heart disease in her maternal grandmother; Migraines in her mother; Stroke in her father. Social History:  reports that she has quit smoking. She has never used smokeless tobacco. She reports that she does not drink alcohol and does not use drugs.     Maternal Diabetes: Yes:  Diabetes Type:  Diet controlled Had only one elevated FBS on Glucola, good control with diet Genetic Screening: Normal Maternal Ultrasounds/Referrals: Normal Fetal Ultrasounds or other Referrals:  None Maternal Substance Abuse:  No Significant Maternal Medications:  None Significant Maternal Lab Results:  Group B Strep positive Number of Prenatal Visits:greater  than 3 verified prenatal visits Other Comments:  None  Review of Systems  Constitutional:  Negative for chills and fever.  Eyes:  Negative for visual disturbance.  Respiratory:  Negative for shortness of breath.   Gastrointestinal:  Positive for abdominal pain. Negative for nausea.  Genitourinary:  Positive for pelvic pain. Negative for vaginal bleeding.  Musculoskeletal:  Positive for back pain.   Maternal Medical History:  Reason for admission: Contractions.  Nausea.  Contractions: Frequency: regular.   Perceived severity is moderate.   Fetal activity: Perceived fetal activity is normal.   Last perceived fetal movement was within the past hour.   Prenatal complications: No bleeding or PIH.   Prenatal Complications - Diabetes: gestational. Diabetes is managed by diet.     Dilation: Lip/rim Effacement (%): 90 Station: Plus 1 Exam by:: Aletta Edouard CNM Blood pressure 117/81, pulse 82, temperature 98 F (36.7 C), temperature source Oral, resp. rate 17, height 5\' 2"  (1.575 m), weight 90.6 kg, last menstrual period 01/08/2022, SpO2 99 %, unknown if currently breastfeeding. Maternal Exam:  Uterine Assessment: Contraction strength is moderate.  Abdomen: Patient reports no abdominal tenderness. Fetal presentation: vertex Introitus: Normal vulva. Normal vagina.  Ferning test: not done.  Pelvis: adequate for delivery.   Cervix: Cervix evaluated by digital exam.  Fetal Exam Fetal Monitor Review: Mode: ultrasound.   Baseline rate: 135.  Variability: moderate (6-25 bpm).   Pattern: no decelerations and accelerations present.   Fetal State Assessment: Category I - tracings are normal.   Physical Exam Constitutional:      General: She is not in acute distress.    Appearance: She is not ill-appearing or toxic-appearing.  HENT:     Head: Normocephalic.  Cardiovascular:     Rate and Rhythm: Normal rate.  Pulmonary:     Effort: Pulmonary effort is normal.  Abdominal:      General: There is no distension.     Palpations: Abdomen is soft.     Tenderness: There is no abdominal tenderness.  Genitourinary:    General: Normal vulva.  Musculoskeletal:        General: Normal range of motion.  Neurological:     General: No focal deficit present.     Mental Status: She is alert.  Psychiatric:        Mood and Affect: Mood normal.     Prenatal labs: ABO, Rh: --/--/O POS (06/13 0130) Antibody: NEG (06/13 0130) Rubella: 1.57 (11/20 0937) RPR: NON REACTIVE (06/13 0130)  HBsAg: Negative (11/20 0937)  HIV: Non Reactive (03/14 0845)  GBS: Positive/-- (06/04 0956)   Assessment/Plan: Single IUP at [redacted]w[redacted]d Active Labor Diet controlled Gestational Diabetes  Admit to Labor and Delivery Routine orders Anticipate SVD   Wynelle Bourgeois 10/18/2022, 8:26 PM

## 2022-10-19 ENCOUNTER — Telehealth (HOSPITAL_BASED_OUTPATIENT_CLINIC_OR_DEPARTMENT_OTHER): Payer: Self-pay | Admitting: *Deleted

## 2022-10-19 ENCOUNTER — Encounter (HOSPITAL_BASED_OUTPATIENT_CLINIC_OR_DEPARTMENT_OTHER): Payer: Self-pay | Admitting: Obstetrics & Gynecology

## 2022-10-19 NOTE — Telephone Encounter (Signed)
Called pt to set up BP check and mood check appt. Appt provided. Pt reports that she has been checking her BP and it has been running around 130/100. Denies symptoms. Pt has one more dose of lasix to take. Advised pt to continue checking BP and if diastolic >110 or if she develops symptoms, she should seek care at [facility].  Pt verbalized understanding.

## 2022-10-20 ENCOUNTER — Encounter: Payer: Self-pay | Admitting: Family Medicine

## 2022-10-20 MED ORDER — NIFEDIPINE ER 30 MG PO TB24
30.0000 mg | ORAL_TABLET | Freq: Every day | ORAL | 1 refills | Status: DC
Start: 2022-10-20 — End: 2022-12-13

## 2022-10-22 ENCOUNTER — Inpatient Hospital Stay (HOSPITAL_COMMUNITY): Payer: Medicaid Other

## 2022-10-23 ENCOUNTER — Ambulatory Visit (INDEPENDENT_AMBULATORY_CARE_PROVIDER_SITE_OTHER): Payer: Medicaid Other | Admitting: Obstetrics & Gynecology

## 2022-10-23 ENCOUNTER — Encounter (HOSPITAL_BASED_OUTPATIENT_CLINIC_OR_DEPARTMENT_OTHER): Payer: Self-pay | Admitting: Obstetrics & Gynecology

## 2022-10-23 VITALS — BP 128/101 | HR 95 | Wt 179.2 lb

## 2022-10-23 DIAGNOSIS — R030 Elevated blood-pressure reading, without diagnosis of hypertension: Secondary | ICD-10-CM

## 2022-10-23 DIAGNOSIS — Z8632 Personal history of gestational diabetes: Secondary | ICD-10-CM | POA: Diagnosis not present

## 2022-10-23 MED ORDER — LABETALOL HCL 100 MG PO TABS: 100.0000 mg | ORAL_TABLET | Freq: Two times a day (BID) | ORAL | 1 refills | Status: DC

## 2022-10-23 NOTE — Progress Notes (Signed)
GYNECOLOGY  VISIT  CC:   recheck  HPI: 27 y.o. [id number] Single Black or African American female here for blood pressure check.  Developed elevated blood pressures after delivery.  Was discharged on lasix.  Completed course.  Not having swelling in feet or hands.  Diastolic is elevated with procardia that was started when in hospital for delivery.  Concerned about increasing dosage.  Treatment options discussed.  Will switch to labetalol and titrate dosing if needed.  She is feeling the baby blues but does not want any medication.  She is doing therapy with [facility] and does meditation.   Past Medical History:  Diagnosis Date   ADHD    Gestational diabetes     MEDS:   Current Outpatient Medications on File Prior to Visit  Medication Sig Dispense Refill   amphetamine-dextroamphetamine (ADDERALL XR) 20 MG 24 hr capsule Take 1 capsule (20 mg total) by mouth every morning. 30 capsule 0   ibuprofen (ADVIL) 600 MG tablet Take 1 tablet (600 mg total) by mouth every 6 (six) hours. 30 tablet 0   NIFEdipine (ADALAT CC) 30 MG 24 hr tablet Take 1 tablet (30 mg total) by mouth daily. 30 tablet 1   Prenatal Vit-Fe Fumarate-FA (PRENATAL MULTIVITAMIN) TABS tablet Take 1 tablet by mouth daily at 12 noon.     albuterol (VENTOLIN HFA) 108 (90 Base) MCG/ACT inhaler Inhale 2 puffs into the lungs every 6 (six) hours as needed for wheezing or shortness of breath. (Patient not taking: Reported on 08/14/2022) 8 g 0   furosemide (LASIX) 20 MG tablet Take 1 tablet (20 mg total) by mouth daily. (Patient not taking: Reported on 10/23/2022) 7 tablet 0   promethazine (PHENERGAN) 25 MG tablet Take 1 tablet (25 mg total) by mouth every 6 (six) hours as needed for nausea or vomiting. (Patient not taking: Reported on 10/01/2022) 30 tablet 1   No current facility-administered medications on file prior to visit.    ALLERGIES: Bactrim [sulfamethoxazole-trimethoprim] and Doxycycline  SH:  single, non smoker  Review of  Systems  Constitutional: Negative.   Genitourinary: Negative.   Neurological: Negative.     PHYSICAL EXAMINATION:    BP (!) 128/101 (BP Location: Right Arm, Patient Position: Sitting, Cuff Size: Large)   Pulse 95   Wt 179 lb 3.2 oz (81.3 kg)   LMP 01/08/2022   BMI 32.78 kg/m     General appearance: alert, cooperative and appears stated age CV:  Regular rate and rhythm Lungs:  clear to auscultation, no wheezes, rales or rhonchi, symmetric air entry   Assessment/Plan: 1. Elevated blood pressure reading - will switch to labetolol 100mg  bid.  Rx to pharmacy.   - pt will keep monitoring at home and return in 1 week for BP check  2. History of gestational diabetes - will repeat testing at 6 week post partum appt  3. Postpartum state

## 2022-10-26 ENCOUNTER — Encounter (HOSPITAL_BASED_OUTPATIENT_CLINIC_OR_DEPARTMENT_OTHER): Payer: Self-pay | Admitting: Obstetrics & Gynecology

## 2022-10-29 NOTE — BH Specialist Note (Signed)
Integrated Behavioral Health via Telemedicine Visit  11/08/2022 Hayley Lawson [id number]  Number of Integrated Behavioral Health Clinician visits: 4- Fourth Visit  Session Start time: 1520   Session End time: 1539  Total time in minutes: 19   Referring Provider: Catalina Antigua, MD Patient/Family location: Home [facility] Provider location: [facility]  All persons participating in visit: Patient Hayley Lawson and [name]   Types of Service: Individual psychotherapy and Video visit  I connected with Hayley Lawson and/or [name]  n/a  via  Telephone or Engineer, civil (consulting)  (Video is Caregility application) and verified that I am speaking with the correct person using two identifiers. Discussed confidentiality: Yes   I discussed the limitations of telemedicine and the availability of in person appointments.  Discussed there is a possibility of technology failure and discussed alternative modes of communication if that failure occurs.  I discussed that engaging in this telemedicine visit, they consent to the provision of behavioral healthcare and the services will be billed under their insurance.  Patient and/or legal guardian expressed understanding and consented to Telemedicine visit: Yes   Presenting Concerns: Patient and/or family reports the following symptoms/concerns: Worry about returning to work and financial stress; requesting therapist options; open to referral to psychiatry.  Duration of problem: Postpartum increase; Severity of problem: moderate  Patient and/or Family's Strengths/Protective Factors: Social connections, Concrete supports in place (healthy food, safe environments, etc.), and Sense of purpose  Goals Addressed: Patient will:  Reduce symptoms of: anxiety, depression, and stress   Increase knowledge and/or ability of: healthy habits and stress reduction   Demonstrate  ability to: Increase healthy adjustment to current life circumstances and Increase adequate support systems for patient/family  Progress towards Goals: Ongoing  Interventions: Interventions utilized:  Psychoeducation and/or Health Education, Link to [employer], and Supportive Reflection Standardized Assessments completed: GAD-7 and PHQ 9  Patient and/or Family Response: Patient agrees with treatment plan.   Assessment: Patient currently experiencing ADHD; Psychosocial stress.   Patient may benefit from continued therapeutic intervention  .  Plan: Follow up with behavioral health clinician on : Two weeks Behavioral recommendations:  -Continue taking Adderall as prescribed by PCP -Accept referral to psychiatry/use [facility] walk-in hours to be seen quickest -Continue plan to establish care with ongoing therapist of choice -Continue prioritizing healthy self-care (regular meals, adequate rest; allowing practical help from supportive friends and family) until at least postpartum medical appointment -Consider new mom support group as needed at either [email or web address] or [email or web address]  -Consider [employer] as additional support (help updating resume, etc.)  Referral(s): Integrated Art gallery manager (In Clinic), Community Mental Health Services (LME/Outside Clinic), and Community Resources:  new mom support; Women's Resource  I discussed the assessment and treatment plan with the patient and/or parent/guardian. They were provided an opportunity to ask questions and all were answered. They agreed with the plan and demonstrated an understanding of the instructions.   They were advised to call back or seek an in-person evaluation if the symptoms worsen or if the condition fails to improve as anticipated.  Valetta Close Tereasa Yilmaz, LCSW     11/08/2022    3:25 PM 09/10/2022   10:22 AM 07/12/2022    1:24 PM 05/14/2022    2:22 PM 04/05/2022   10:34 AM   Depression screen PHQ 2/9  Decreased Interest 1 0 1 0 2  Down, Depressed, Hopeless 1 0 1 0 3  PHQ - 2 Score  2 0 2 0 5  Altered sleeping 0  1 0 3  Tired, decreased energy 1  0 0 3  Change in appetite 1  0  2  Feeling bad or failure about yourself  1  0 0 1  Trouble concentrating 0  0 0 3  Moving slowly or fidgety/restless 0  0 0 0  Suicidal thoughts 0  0 0 0  PHQ-9 Score 5  3 0 17  Difficult doing work/chores    Not difficult at all         11/08/2022    3:24 PM 09/10/2022   10:22 AM 07/12/2022    1:24 PM 05/14/2022    2:22 PM  GAD 7 : Generalized Anxiety Score  Nervous, Anxious, on Edge 1 0 2 0  Control/stop worrying 1 0 2 0  Worry too much - different things 3 0 2 0  Trouble relaxing 1 0 1 0  Restless 1 0 2 0  Easily annoyed or irritable 1 0 1 0  Afraid - awful might happen 0 0 0 0  Total GAD 7 Score 8 0 10 0  Anxiety Difficulty  Not difficult at all

## 2022-10-30 ENCOUNTER — Encounter (HOSPITAL_BASED_OUTPATIENT_CLINIC_OR_DEPARTMENT_OTHER): Payer: Self-pay

## 2022-10-30 ENCOUNTER — Ambulatory Visit (HOSPITAL_BASED_OUTPATIENT_CLINIC_OR_DEPARTMENT_OTHER): Payer: Medicaid Other

## 2022-10-31 ENCOUNTER — Ambulatory Visit (INDEPENDENT_AMBULATORY_CARE_PROVIDER_SITE_OTHER): Payer: Medicaid Other

## 2022-10-31 ENCOUNTER — Encounter (HOSPITAL_BASED_OUTPATIENT_CLINIC_OR_DEPARTMENT_OTHER): Payer: Self-pay

## 2022-10-31 DIAGNOSIS — Z0130 Encounter for examination of blood pressure without abnormal findings: Secondary | ICD-10-CM

## 2022-10-31 NOTE — Progress Notes (Signed)
Patient came in today for a blood pressure check. Blood Pressure was 124/90 today. Advised patient to continue medication. We will notify provider and will let patient know if there are any medication changes. tbw

## 2022-11-04 ENCOUNTER — Other Ambulatory Visit (HOSPITAL_BASED_OUTPATIENT_CLINIC_OR_DEPARTMENT_OTHER): Payer: Self-pay | Admitting: Family Medicine

## 2022-11-04 DIAGNOSIS — F902 Attention-deficit hyperactivity disorder, combined type: Secondary | ICD-10-CM

## 2022-11-05 MED ORDER — AMPHETAMINE-DEXTROAMPHET ER 20 MG PO CP24: 20.0000 mg | ORAL_CAPSULE | ORAL | 0 refills | Status: DC

## 2022-11-08 ENCOUNTER — Ambulatory Visit (INDEPENDENT_AMBULATORY_CARE_PROVIDER_SITE_OTHER): Payer: Medicaid Other | Admitting: Clinical

## 2022-11-08 DIAGNOSIS — F909 Attention-deficit hyperactivity disorder, unspecified type: Secondary | ICD-10-CM | POA: Diagnosis not present

## 2022-11-08 DIAGNOSIS — Z658 Other specified problems related to psychosocial circumstances: Secondary | ICD-10-CM

## 2022-11-08 NOTE — Patient Instructions (Signed)
[facility] [address] [place], [place] [address] [phone] (main office) [phone] [facility] office)  [facility]  [address], [place], [place] [address] [phone] or [phone] [facility] 24/7 FOR ANYONE [address], [place], [place]  [phone] Fax: [phone] [email or web address] *Interpreters available *Accepts all insurance and uninsured for Urgent Care needs *Accepts Medicaid and uninsured for outpatient treatment (below)    ONLY FOR [facility]  Below:   Outpatient New Patient Assessment/Therapy Walk-ins:        Monday -Thursday 8am until slots are full.        Every Friday 1pm-4pm  (first come, first served)                   New Patient Psychiatry/Medication Management        Monday-Friday 8am-11am (first come, first served)              For all walk-ins we ask that you arrive by 7:15am, because patients will be seen in the order of arrival.     [facility]   Mon-Fri: 8am-5pm [address], [place], [place] [phone](JJOAC); [phone](fax) [email or web address]  *Accepts Medicare  [facility] [place], Fri: 8am-4pm [address], [place], [place] [address] [phone] (phone); [phone] (fax) [email or web address]  *Accepts Medicare  [facility] Mon-Fri: 9am-5pm  [address], [place], [place] [phone] (phone); [phone]  [email or web address]  *Accepts Medicaid  [facility], 8:30am-12pm/1pm-2:30pm [address], [place], [place] [phone] (phone); [phone] (fax) [email or web address].[email or web address]  *Accepts Medicaid, sliding-scale*Bilingual services available  [facility] Mon-Fri, 8am-7pm [address] [place], [place]  [phone](BOFBP);  [phone](fax) www.[email or web address]  *Accepts Medicaid *Bilingual services available  [facility] Mon-Fri: 8am-5pm, Saturday by appointment only [address] [place], [place], [place] [phone] (phone); [phone] (fax) [email or web address]   [facility] [address], [place], [place] [phone] [email or web address]  *Free & reduced services for uninsured and underinsured individuals *Bilingual services for Spanish-speaking clients 21 and under  [facility], [address] [place], [place] [phone](ASNKN); [phone](fax) [email or web address]  *Bring your own interpreter at first visit *Accepts Medicare and [facility]  [facility] Mon-Fri: 9am-5:30pm [address], [place], [place] [phone] (phone), [phone] (fax) After hours crisis line: [phone] [email or web address]  *Accepts Medicare and Medicaid  [employer], 8am-6pm [address], [place], [place]  [phone] (phone); [phone] (fax) [email or web address]  *Subsidized costs available  [facility] Mon-Fri: 8am-4pm [address] [place], [place] [phone](CHYIF); [phone](fax) [email or web address]  *Accepts Medicaid  [facility] Same day access hours: Mon-Fri, 8:30-3pm Crisis hours: Mon-Fri, 8am-5pm [address], [place], [place] [phone]  [facility] Same day access hours: Mon-Fri, 8:30-3pm Crisis hours: Mon-Fri, 8am-8pm [address], [place], [place] [phone] (phone); [phone] (fax) www.[email or web address]  *Accepts Medicaid and Medicare  [facility] [place], [place], Fri: 9am-9pm Tues, Thurs: 9am-6pm [address] [place], [place], [place]  [phone] (phone); [phone]  (fax) [email or web address]  *(Accepts Medicare and Medicaid; payment plans available)*Bilingual services available  [facility] [address] [place], [place] [phone] (phone); [phone] (fax) [email or web address]   [facility] [address], [place], [place]  [phone]  [email or web address]  *Bilingual services available  [facility] Mon-Thurs: 8am-8pm [address], [place], [place] [phone] (phone); [phone] (fax) [email or web address]  *Accepts Medicaid*Bilingual services available  [facility] [address] [place], [place] [phone] (phone) [email or web address]  *Accepts Medicaid *Bilingual services available  [facility] Mon-Fri, 9am-4:45pm [address], [place], [place] [phone] (phone); [phone] (fax) [email or web address]  *Accepts Medicare  [facility] Mon-Thurs: 8:30-8pm, Fri: 8:30am-7pm [address], [place], [place] (3rd floor) [phone] (phone); [phone] (fax) [email or web address]  *Accepts Medicaid; income-based reduced rates available  [facility] Mon-Fri: 8am-5pm [address], [place], [place] [address] [phone] (phone); [phone] (fax) [email or web address]  *Accepts Medicaid*Bilingual services available

## 2022-11-08 NOTE — Addendum Note (Signed)
Addended by: Hulda Marin C on: 11/08/2022 05:11 PM   Modules accepted: Orders

## 2022-11-12 NOTE — BH Specialist Note (Signed)
Integrated Behavioral Health via Telemedicine Visit  11/12/2022 Hayley Lawson [id number]  Number of Integrated Behavioral Health Clinician visits: 4- Fourth Visit  Session Start time: 1520   Session End time: 1539  Total time in minutes: 19   Referring Provider: Valentina Shaggy, MD Patient/Family location: Home*** [facility] Provider location: [facility]  All persons participating in visit: Patient Hayley Lawson and Hayley Lawson Hayley Lawson ***  Types of Service: {CHL AMB TYPE OF SERVICE:[phone]}  I connected with Hayley Lawson and/or Hayley Lawson's {family members:20773} via  Telephone or Engineer, civil (consulting)  (Video is Surveyor, mining) and verified that I am speaking with the correct person using two identifiers. Discussed confidentiality: Yes   I discussed the limitations of telemedicine and the availability of in person appointments.  Discussed there is a possibility of technology failure and discussed alternative modes of communication if that failure occurs.  I discussed that engaging in this telemedicine visit, they consent to the provision of behavioral healthcare and the services will be billed under their insurance.  Patient and/or legal guardian expressed understanding and consented to Telemedicine visit: Yes   Presenting Concerns: Patient and/or family reports the following symptoms/concerns: *** Duration of problem: ***; Severity of problem: {Mild/Moderate/Severe:20260}  Patient and/or Family's Strengths/Protective Factors: {CHL AMB BH PROTECTIVE FACTORS:[phone]}  Goals Addressed: Patient will:  Reduce symptoms of: {IBH Symptoms:21014056}   Increase knowledge and/or ability of: {IBH Patient Tools:21014057}   Demonstrate ability to: {IBH Goals:21014053}  Progress towards Goals: {CHL AMB BH PROGRESS TOWARDS [phone]}  Interventions: Interventions utilized:  {IBH  Interventions:21014054} Standardized Assessments completed: {IBH Screening Tools:21014051}  Patient and/or Family Response: Patient agrees with treatment plan. ***  Assessment: Patient currently experiencing ***.   Patient may benefit from continued therapeutic intervention *** .  Plan: Follow up with behavioral health clinician on : *** Behavioral recommendations:  -*** -*** Referral(s): {IBH Referrals:21014055}  I discussed the assessment and treatment plan with the patient and/or parent/guardian. They were provided an opportunity to ask questions and all were answered. They agreed with the plan and demonstrated an understanding of the instructions.   They were advised to call back or seek an in-person evaluation if the symptoms worsen or if the condition fails to improve as anticipated.  Valetta Close Madinah Quarry, LCSW     11/08/2022    3:25 PM 09/10/2022   10:22 AM 07/12/2022    1:24 PM 05/14/2022    2:22 PM 04/05/2022   10:34 AM  Depression screen PHQ 2/9  Decreased Interest 1 0 1 0 2  Down, Depressed, Hopeless 1 0 1 0 3  PHQ - 2 Score 2 0 2 0 5  Altered sleeping 0  1 0 3  Tired, decreased energy 1  0 0 3  Change in appetite 1  0  2  Feeling bad or failure about yourself  1  0 0 1  Trouble concentrating 0  0 0 3  Moving slowly or fidgety/restless 0  0 0 0  Suicidal thoughts 0  0 0 0  PHQ-9 Score 5  3 0 17  Difficult doing work/chores    Not difficult at all       11/08/2022    3:24 PM 09/10/2022   10:22 AM 07/12/2022    1:24 PM 05/14/2022    2:22 PM  GAD 7 : Generalized Anxiety Score  Nervous, Anxious, on Edge 1 0 2 0  Control/stop worrying 1 0 2 0  Worry too much -  different things 3 0 2 0  Trouble relaxing 1 0 1 0  Restless 1 0 2 0  Easily annoyed or irritable 1 0 1 0  Afraid - awful might happen 0 0 0 0  Total GAD 7 Score 8 0 10 0  Anxiety Difficulty  Not difficult at all

## 2022-11-14 ENCOUNTER — Ambulatory Visit (INDEPENDENT_AMBULATORY_CARE_PROVIDER_SITE_OTHER): Payer: Medicaid Other | Admitting: Obstetrics & Gynecology

## 2022-11-14 ENCOUNTER — Encounter (HOSPITAL_BASED_OUTPATIENT_CLINIC_OR_DEPARTMENT_OTHER): Payer: Self-pay | Admitting: Obstetrics & Gynecology

## 2022-11-14 VITALS — BP 120/92 | HR 81 | Wt 178.8 lb

## 2022-11-14 DIAGNOSIS — Z658 Other specified problems related to psychosocial circumstances: Secondary | ICD-10-CM | POA: Diagnosis not present

## 2022-11-14 DIAGNOSIS — Z1331 Encounter for screening for depression: Secondary | ICD-10-CM

## 2022-11-14 DIAGNOSIS — O165 Unspecified maternal hypertension, complicating the puerperium: Secondary | ICD-10-CM | POA: Diagnosis not present

## 2022-11-14 DIAGNOSIS — Z3A00 Weeks of gestation of pregnancy not specified: Secondary | ICD-10-CM

## 2022-11-14 MED ORDER — SERTRALINE HCL 50 MG PO TABS
50.0000 mg | ORAL_TABLET | Freq: Every day | ORAL | 2 refills | Status: DC
Start: 2022-11-14 — End: 2023-02-05

## 2022-11-17 NOTE — Progress Notes (Signed)
GYNECOLOGY  VISIT  CC:   BP check/discuss work concerns/perineal check  HPI: 27 y.o. [id number] Single Black or African American female here for several concerns.  She has post partum hypertension issues.  She is on antihypertensives.  Blood pressures are ok.  Post partum depression screen is elevated.  Has declined medication.  Options reviewed.  She is now desirous of treatment.  She has seen [facility] and plans to continue follow up with her.  However, this is adding stress to her post partum time.  She would like to discuss if possible a letter for her to work limited hours or from home.  Advised I can write letter but would be best for her to decide if it is limited hours or remote working that she desires.  She is going to let me know what she would like me to do.   Past Medical History:  Diagnosis Date   ADHD    Gestational diabetes     MEDS:   Current Outpatient Medications on File Prior to Visit  Medication Sig Dispense Refill   amphetamine-dextroamphetamine (ADDERALL XR) 20 MG 24 hr capsule Take 1 capsule (20 mg total) by mouth every morning. 30 capsule 0   labetalol (NORMODYNE) 100 MG tablet Take 1 tablet (100 mg total) by mouth 2 (two) times daily. 60 tablet 1   Prenatal Vit-Fe Fumarate-FA (PRENATAL MULTIVITAMIN) TABS tablet Take 1 tablet by mouth daily at 12 noon.     albuterol (VENTOLIN HFA) 108 (90 Base) MCG/ACT inhaler Inhale 2 puffs into the lungs every 6 (six) hours as needed for wheezing or shortness of breath. (Patient not taking: Reported on 08/14/2022) 8 g 0   ibuprofen (ADVIL) 600 MG tablet Take 1 tablet (600 mg total) by mouth every 6 (six) hours. (Patient not taking: Reported on 10/31/2022) 30 tablet 0   NIFEdipine (ADALAT CC) 30 MG 24 hr tablet Take 1 tablet (30 mg total) by mouth daily. (Patient not taking: Reported on 10/31/2022) 30 tablet 1   promethazine (PHENERGAN) 25 MG tablet Take 1 tablet (25 mg total) by mouth every 6 (six) hours as needed for nausea or  vomiting. (Patient not taking: Reported on 10/01/2022) 30 tablet 1   No current facility-administered medications on file prior to visit.    ALLERGIES: Bactrim [sulfamethoxazole-trimethoprim] and Doxycycline  SH:  single, non smoker  Review of Systems  Constitutional: Negative.   Genitourinary: Negative.     PHYSICAL EXAMINATION:    BP (!) 120/92 (BP Location: Left Arm, Patient Position: Sitting, Cuff Size: Large)   Pulse 81   Wt 178 lb 12.8 oz (81.1 kg)   LMP 01/08/2022   BMI 32.70 kg/m     General appearance: alert, cooperative and appears stated age Lymph:  no inguinal LAD noted  Pelvic: External genitalia:  no lesions              Urethra:  normal appearing urethra with no masses, tenderness or lesions              Bartholins and Skenes: normal                 Vagina: normal appearing vagina with normal color and discharge, no lesions               Anus: no lesions   Assessment/Plan: 1. Positive depression screening - will start sertraline 50mg  daily.  Rx to pharmacy.  She will give update at post partum appt with Misty Stanley  2. First degree  perineal laceration during delivery - well healed  3. Psychosocial stressors - she is going to let me know what kind of accomodation she feels would be most beneficial.  4. Postpartum hypertension - continues on anti-hypertensives

## 2022-11-20 ENCOUNTER — Ambulatory Visit (HOSPITAL_BASED_OUTPATIENT_CLINIC_OR_DEPARTMENT_OTHER): Payer: Medicaid Other | Admitting: Advanced Practice Midwife

## 2022-11-26 ENCOUNTER — Ambulatory Visit: Payer: Medicaid Other | Admitting: Clinical

## 2022-11-26 ENCOUNTER — Ambulatory Visit (HOSPITAL_BASED_OUTPATIENT_CLINIC_OR_DEPARTMENT_OTHER): Payer: Medicaid Other | Admitting: Medical

## 2022-11-26 ENCOUNTER — Encounter (HOSPITAL_BASED_OUTPATIENT_CLINIC_OR_DEPARTMENT_OTHER): Payer: Self-pay

## 2022-11-27 ENCOUNTER — Ambulatory Visit (HOSPITAL_BASED_OUTPATIENT_CLINIC_OR_DEPARTMENT_OTHER): Payer: Medicaid Other | Admitting: Family Medicine

## 2022-11-29 ENCOUNTER — Encounter (HOSPITAL_BASED_OUTPATIENT_CLINIC_OR_DEPARTMENT_OTHER): Payer: Self-pay | Admitting: Student

## 2022-11-29 ENCOUNTER — Other Ambulatory Visit (HOSPITAL_COMMUNITY)
Admission: RE | Admit: 2022-11-29 | Discharge: 2022-11-29 | Disposition: A | Discharge status: Home / Self Care | Payer: Medicaid Other | Source: Ambulatory Visit | Attending: Student | Admitting: Student

## 2022-11-29 ENCOUNTER — Ambulatory Visit (INDEPENDENT_AMBULATORY_CARE_PROVIDER_SITE_OTHER): Payer: Medicaid Other | Admitting: Student

## 2022-11-29 DIAGNOSIS — Z3009 Encounter for other general counseling and advice on contraception: Secondary | ICD-10-CM

## 2022-11-29 DIAGNOSIS — N898 Other specified noninflammatory disorders of vagina: Secondary | ICD-10-CM | POA: Insufficient documentation

## 2022-11-29 NOTE — Progress Notes (Signed)
Post Partum Visit Note  Hayley Lawson is a 27 y.o. [phone] female who presents for a postpartum visit. She is 7 week postpartum following a normal spontaneous vaginal delivery.  I have fully reviewed the prenatal and intrapartum course. The delivery was at 39.3 gestational weeks.  Anesthesia: none. Postpartum course has been uncomplicated. Patient has had BP and incision check since delivery. Baby is doing well. Baby is feeding by breast. Bleeding no bleeding. Bowel function is normal. Bladder function is normal. Patient is sexually active. Contraception method is IUD. Patient is undecided between [place] and [place]. Postpartum depression screening: negative.     Health Maintenance Due  Topic Date Due   PAP-Cervical Cytology Screening  Never done   PAP SMEAR-Modifier  Never done   COVID-19 Vaccine (2 - 2023-24 season) 12/29/2021   INFLUENZA VACCINE  11/29/2022    The following portions of the patient's history were reviewed and updated as appropriate: allergies, current medications, past family history, past medical history, past social history, past surgical history, and problem list.  Review of Systems A comprehensive review of systems was negative.  Objective:  BP (!) 123/103 (BP Location: Right Arm, Patient Position: Sitting, Cuff Size: Large)   Pulse 90   Wt 177 lb 6.4 oz (80.5 kg)   LMP 01/08/2022   BMI 32.45 kg/m    General:  alert, cooperative, and appears stated age   Breasts:  not indicated  Lungs: Normal work of breathing  Heart:  regular rate and rhythm  Abdomen: soft, non-tender; bowel sounds normal; no masses,  no organomegaly   Wound N/a  GU exam:  abnormal vaginal discharge and odor ; otherwise normal exam, Perineal incision healed       Assessment:   1. Encounter for postpartum care of lactating mother 2. Vaginal discharge 3. Counseling for birth control regarding intrauterine device (IUD)   Plan:   Essential components of care per ACOG  recommendations:  1.  Mood and well being: Patient with negative depression screening today. Reviewed local resources for support.  - Patient tobacco use? No.   - hx of drug use? No.    2. Infant care and feeding:  -Patient currently breastmilk feeding? Yes. Reviewed importance of draining breast regularly to support lactation.  -Social determinants of health (SDOH) reviewed in EPIC. No concerns  3. Sexuality, contraception and birth spacing - Patient does not want a pregnancy in the next year.  Desired family size is 4 children.  - Reviewed reproductive life planning. Reviewed contraceptive methods based on pt preferences and effectiveness.  Patient desired IUD or IUS today.  However, patient is undecided on hormonal vs non-hormonal and has engaged in unprotected sex. Plan to reschedule IUD placement - Discussed birth spacing of 18 months  4. Sleep and fatigue -Encouraged family/partner/community support of 4 hrs of uninterrupted sleep to help with mood and fatigue  5. Physical Recovery  - Discussed patients delivery and complications. She describes her labor as good. - Patient had a Vaginal, no problems at delivery. Patient had a 1st degree laceration. Perineal healing reviewed. Patient expressed understanding - Patient has urinary incontinence? No. - Patient is safe to resume physical and sexual activity  6.  Health Maintenance - HM due items addressed Yes - Last pap smear No results found for: "DIAGPAP" Pap smear not done at today's visit. Normal Pap on 01/2022  -Breast Cancer screening indicated? No.   7. Chronic Disease/Pregnancy Condition follow up: Hypertension  Initial blood pressure elevated. Re-check reported  as normal  - PCP follow up  Corlis Hove, NP [facility] for [facility], [facility]

## 2022-12-04 ENCOUNTER — Other Ambulatory Visit (HOSPITAL_BASED_OUTPATIENT_CLINIC_OR_DEPARTMENT_OTHER): Payer: Self-pay | Admitting: Family Medicine

## 2022-12-04 DIAGNOSIS — F902 Attention-deficit hyperactivity disorder, combined type: Secondary | ICD-10-CM

## 2022-12-05 ENCOUNTER — Ambulatory Visit (HOSPITAL_BASED_OUTPATIENT_CLINIC_OR_DEPARTMENT_OTHER): Payer: Medicaid Other | Admitting: Family Medicine

## 2022-12-05 VITALS — BP 138/100 | HR 86 | Ht 62.0 in | Wt 176.0 lb

## 2022-12-05 DIAGNOSIS — F902 Attention-deficit hyperactivity disorder, combined type: Secondary | ICD-10-CM | POA: Diagnosis not present

## 2022-12-05 MED ORDER — AMPHETAMINE-DEXTROAMPHET ER 20 MG PO CP24: 20.0000 mg | ORAL_CAPSULE | ORAL | 0 refills | Status: DC

## 2022-12-05 NOTE — Assessment & Plan Note (Signed)
Patient reports to be doing well with current dose of medication, indicates that she continues to have good control of symptoms.  She is now postpartum.  Blood pressure is elevated in office today and she has been working with her OB/GYN regarding elevated blood pressure readings.  Despite being on labetalol currently, she continues to have elevated readings at home.  She denies any issues with chest pain, palpitations, new sleep issues, aside from having a newborn at home. I feel the current blood pressure issues are more likely to be related to postpartum setting as opposed to an adverse reaction of medication given that patient has been taking medication without issue previously.  At this time, can continue with current medication regimen, recommend close follow-up with her OB/GYN regarding management of blood pressure Plan for follow-up in about 3 months for medication monitoring

## 2022-12-05 NOTE — Progress Notes (Signed)
    Procedures performed today:    None.  Independent interpretation of notes and tests performed by another provider:   None.  Brief History, Exam, Impression, and Recommendations:    BP (!) 138/100 (BP Location: Left Arm, Patient Position: Sitting, Cuff Size: Normal)   Pulse 86   Ht 5\' 2"  (1.575 m)   Wt 176 lb (79.8 kg)   LMP 01/08/2022   SpO2 99%   BMI 32.19 kg/m   Attention deficit hyperactivity disorder (ADHD), combined type Assessment & Plan: Patient reports to be doing well with current dose of medication, indicates that she continues to have good control of symptoms.  She is now postpartum.  Blood pressure is elevated in office today and she has been working with her OB/GYN regarding elevated blood pressure readings.  Despite being on labetalol currently, she continues to have elevated readings at home.  She denies any issues with chest pain, palpitations, new sleep issues, aside from having a newborn at home. I feel the current blood pressure issues are more likely to be related to postpartum setting as opposed to an adverse reaction of medication given that patient has been taking medication without issue previously.  At this time, can continue with current medication regimen, recommend close follow-up with her OB/GYN regarding management of blood pressure Plan for follow-up in about 3 months for medication monitoring   Return in about 3 months (around 03/07/2023) for med check.   ___________________________________________ Garion Wempe de [place], MD, ABFM, CAQSM Primary Care and Sports Medicine [facility]

## 2022-12-06 ENCOUNTER — Encounter (HOSPITAL_BASED_OUTPATIENT_CLINIC_OR_DEPARTMENT_OTHER): Payer: Self-pay | Admitting: *Deleted

## 2022-12-06 ENCOUNTER — Ambulatory Visit (HOSPITAL_BASED_OUTPATIENT_CLINIC_OR_DEPARTMENT_OTHER): Payer: Medicaid Other | Admitting: Family Medicine

## 2022-12-13 ENCOUNTER — Encounter (HOSPITAL_BASED_OUTPATIENT_CLINIC_OR_DEPARTMENT_OTHER): Payer: Self-pay | Admitting: Obstetrics & Gynecology

## 2022-12-13 ENCOUNTER — Ambulatory Visit (INDEPENDENT_AMBULATORY_CARE_PROVIDER_SITE_OTHER): Payer: Medicaid Other | Admitting: Obstetrics & Gynecology

## 2022-12-13 VITALS — BP 120/68 | HR 84 | Ht 62.0 in | Wt 182.6 lb

## 2022-12-13 DIAGNOSIS — Z8632 Personal history of gestational diabetes: Secondary | ICD-10-CM

## 2022-12-13 DIAGNOSIS — Z30430 Encounter for insertion of intrauterine contraceptive device: Secondary | ICD-10-CM | POA: Diagnosis not present

## 2022-12-13 MED ORDER — PARAGARD INTRAUTERINE COPPER IU IUD
1.0000 | INTRAUTERINE_SYSTEM | Freq: Once | INTRAUTERINE | Status: AC
Start: 2022-12-13 — End: 2022-12-13
  Administered 2022-12-13: 1 via INTRAUTERINE

## 2022-12-13 NOTE — Progress Notes (Signed)
27 y.o. [id number] Single female presents for insertion of Paragard IUD.  Pt has been counseled about alternative forms of contraception including OCPs, progesterone options, sterilization procedures, condoms, and natural family planning.  She feels IUD is the better option for her.  Pt has also been counseled about risks and benefits as well as complications.  Consent is obtained today.  All questions answered prior to start of procedure.    Current contraception: none, she has abstained for two weeks and UPT was negative Last STD testing:  done at 36 weeks LMP:  Patient's last menstrual period was 01/08/2022.  Patient Active Problem List   Diagnosis Date Noted   Elevated blood pressure reading 10/13/2022   History of postpartum depression 10/13/2022   Viral URI 09/10/2022   Migraines 09/05/2021   ADHD 09/05/2021   Past Medical History:  Diagnosis Date   ADHD    GDM (gestational diabetes mellitus) 07/13/2022   Gestational diabetes    History of gestational diabetes 06/13/2022   Normal labor and delivery 10/11/2022   Positive GBS test 10/15/2022   Pregnancy, supervision, high-risk, third trimester 03/12/2022              Nursing Staff    Provider      Office Location    [facility]    Dating     10/15/2022, by Last Menstrual Period      [facility] Model    [x]  Traditional  [ ]  Centering  [ ]  Mom-Baby Dyad    Anatomy US     05/21/2022:  nl but follow up 4 weeks for anatomy follow up done 3/19, f/u 4 weeks      Language     English                Flu Vaccine     Declined    Genetic/Carrier Screen     NIPS:  LR/ Fem   Current Outpatient Medications on File Prior to Visit  Medication Sig Dispense Refill   albuterol (VENTOLIN HFA) 108 (90 Base) MCG/ACT inhaler Inhale 2 puffs into the lungs every 6 (six) hours as needed for wheezing or shortness of breath. 8 g 0   amphetamine-dextroamphetamine (ADDERALL XR) 20 MG 24 hr capsule Take 1 capsule (20 mg total) by mouth every morning. 30 capsule 0   ibuprofen  (ADVIL) 600 MG tablet Take 1 tablet (600 mg total) by mouth every 6 (six) hours. 30 tablet 0   labetalol (NORMODYNE) 100 MG tablet Take 1 tablet (100 mg total) by mouth 2 (two) times daily. 60 tablet 1   Prenatal Vit-Fe Fumarate-FA (PRENATAL MULTIVITAMIN) TABS tablet Take 1 tablet by mouth daily at 12 noon.     promethazine (PHENERGAN) 25 MG tablet Take 1 tablet (25 mg total) by mouth every 6 (six) hours as needed for nausea or vomiting. 30 tablet 1   sertraline (ZOLOFT) 50 MG tablet Take 1 tablet (50 mg total) by mouth daily. 30 tablet 2   No current facility-administered medications on file prior to visit.   Bactrim [sulfamethoxazole-trimethoprim] and Doxycycline  Review of Systems  Constitutional: Negative.   Genitourinary: Negative.    Vitals:   12/13/22 0854 12/13/22 0924  BP: (!) 140/89 120/68  Pulse: 84   Weight: 182 lb 9.6 oz (82.8 kg)   Height: 5\' 2"  (1.575 m)     Gen:  WNWF healthy female NAD Abdomen: soft, non-tender Groin:  no inguinal nodes palpated  Pelvic exam: Vulva:  normal female genitalia Vagina:  normal  vagina Cervix:  Non-tender, Negative CMT, no lesions or redness. Uterus:  normal shape, position and consistency   Procedure:  Speculum reinserted.  Cervix visualized and cleansed with Betadine x 3.  Paracervical block was not placed.  Single toothed tenaculum applied to anterior lip of cervix without difficulty.  Uterus sounded to 8cm.  IUD package was opened.  IUD and introducer passed to fundus and then withdrawn slightly before IUD was passed into endometrial cavity.  Introducer removed.  Strings cut to 2cm.  Tenaculum removed from cervix.  Minimal bleeding noted.  Pt tolerated the procedure well.  All instruments removed from vagina.  Assessment/Plan: 1. Encounter for IUD insertion - Return for recheck 6-8 weeks - Pt aware to call for any concerns - Pt aware removal due no later than 12/12/2032.  IUD card given to pt.  2. History of gestational  diabetes - Glucose tolerance, 2 hours

## 2022-12-13 NOTE — Addendum Note (Signed)
Addended by: Ina Homes B on: 12/13/2022 03:24 PM   Modules accepted: Orders

## 2022-12-14 LAB — GLUCOSE TOLERANCE, 2 HOURS
Glucose, 2 hour: 56 mg/dL — ABNORMAL LOW (ref 70–139)
Glucose, GTT - Fasting: 93 mg/dL (ref 70–99)

## 2022-12-24 ENCOUNTER — Encounter (HOSPITAL_BASED_OUTPATIENT_CLINIC_OR_DEPARTMENT_OTHER): Payer: Self-pay | Admitting: Family Medicine

## 2022-12-24 ENCOUNTER — Other Ambulatory Visit (HOSPITAL_BASED_OUTPATIENT_CLINIC_OR_DEPARTMENT_OTHER): Payer: Self-pay | Admitting: Family Medicine

## 2022-12-24 ENCOUNTER — Other Ambulatory Visit (HOSPITAL_BASED_OUTPATIENT_CLINIC_OR_DEPARTMENT_OTHER): Payer: Self-pay

## 2022-12-24 DIAGNOSIS — F902 Attention-deficit hyperactivity disorder, combined type: Secondary | ICD-10-CM

## 2022-12-24 MED ORDER — AMPHETAMINE-DEXTROAMPHET ER 20 MG PO CP24
20.0000 mg | ORAL_CAPSULE | ORAL | 0 refills | Status: DC
Start: 2022-12-24 — End: 2023-01-01
  Filled 2022-12-24: qty 15, 15d supply, fill #0

## 2022-12-25 ENCOUNTER — Other Ambulatory Visit (HOSPITAL_BASED_OUTPATIENT_CLINIC_OR_DEPARTMENT_OTHER): Payer: Self-pay

## 2023-01-01 MED ORDER — AMPHETAMINE-DEXTROAMPHET ER 20 MG PO CP24
20.0000 mg | ORAL_CAPSULE | ORAL | 0 refills | Status: DC
Start: 2023-01-01 — End: 2023-01-14

## 2023-01-14 MED ORDER — AMPHETAMINE-DEXTROAMPHET ER 20 MG PO CP24: 20.0000 mg | ORAL_CAPSULE | ORAL | 0 refills | Status: DC

## 2023-01-22 ENCOUNTER — Ambulatory Visit (INDEPENDENT_AMBULATORY_CARE_PROVIDER_SITE_OTHER): Payer: Medicaid Other | Admitting: Obstetrics & Gynecology

## 2023-01-22 ENCOUNTER — Encounter (HOSPITAL_BASED_OUTPATIENT_CLINIC_OR_DEPARTMENT_OTHER): Payer: Self-pay | Admitting: Obstetrics & Gynecology

## 2023-01-22 VITALS — BP 113/88 | HR 91 | Wt 180.8 lb

## 2023-01-22 DIAGNOSIS — Z30431 Encounter for routine checking of intrauterine contraceptive device: Secondary | ICD-10-CM

## 2023-01-22 DIAGNOSIS — R599 Enlarged lymph nodes, unspecified: Secondary | ICD-10-CM | POA: Diagnosis not present

## 2023-01-22 DIAGNOSIS — R590 Localized enlarged lymph nodes: Secondary | ICD-10-CM

## 2023-01-22 LAB — CBC WITH DIFFERENTIAL/PLATELET
Basophils Absolute: 0.1 10*3/uL (ref 0.0–0.2)
Basos: 1 %
EOS (ABSOLUTE): 0 10*3/uL (ref 0.0–0.4)
Eos: 1 %
Hematocrit: 42.8 % (ref 34.0–46.6)
Hemoglobin: 14.4 g/dL (ref 11.1–15.9)
Immature Grans (Abs): 0 10*3/uL (ref 0.0–0.1)
Immature Granulocytes: 0 %
Lymphocytes Absolute: 2.8 10*3/uL (ref 0.7–3.1)
Lymphs: 36 %
MCH: 31 pg (ref 26.6–33.0)
MCHC: 33.6 g/dL (ref 31.5–35.7)
MCV: 92 fL (ref 79–97)
Monocytes Absolute: 0.5 10*3/uL (ref 0.1–0.9)
Monocytes: 7 %
Neutrophils Absolute: 4.3 10*3/uL (ref 1.4–7.0)
Neutrophils: 55 %
Platelets: 437 10*3/uL (ref 150–450)
RBC: 4.65 x10E6/uL (ref 3.77–5.28)
RDW: 12.7 % (ref 11.7–15.4)
WBC: 7.7 10*3/uL (ref 3.4–10.8)

## 2023-01-22 NOTE — Progress Notes (Signed)
27 y.o. [id number] Single female presents for followed up after insertion of Mirena IUD on 8/15.  Pt reports she is doing well.  Had some spotting after placement.  This has stopped.  Denies cramping.  No discharge.  She does have an area where she was told she has lipomas present.  She would like me to assess today as well.    LMP:  No LMP recorded.  Patient Active Problem List   Diagnosis Date Noted   Viral URI 09/10/2022   Migraines 09/05/2021   ADHD 09/05/2021   Past Medical History:  Diagnosis Date   ADHD    History of gestational diabetes 06/13/2022   Positive GBS test 10/15/2022   Postpartum hypertension    Current Outpatient Medications on File Prior to Visit  Medication Sig Dispense Refill   albuterol (VENTOLIN HFA) 108 (90 Base) MCG/ACT inhaler Inhale 2 puffs into the lungs every 6 (six) hours as needed for wheezing or shortness of breath. 8 g 0   amphetamine-dextroamphetamine (ADDERALL XR) 20 MG 24 hr capsule Take 1 capsule (20 mg total) by mouth every morning. 30 capsule 0   ibuprofen (ADVIL) 600 MG tablet Take 1 tablet (600 mg total) by mouth every 6 (six) hours. 30 tablet 0   labetalol (NORMODYNE) 100 MG tablet Take 1 tablet (100 mg total) by mouth 2 (two) times daily. 60 tablet 1   Prenatal Vit-Fe Fumarate-FA (PRENATAL MULTIVITAMIN) TABS tablet Take 1 tablet by mouth daily at 12 noon.     promethazine (PHENERGAN) 25 MG tablet Take 1 tablet (25 mg total) by mouth every 6 (six) hours as needed for nausea or vomiting. 30 tablet 1   sertraline (ZOLOFT) 50 MG tablet Take 1 tablet (50 mg total) by mouth daily. 30 tablet 2   No current facility-administered medications on file prior to visit.   Bactrim [sulfamethoxazole-trimethoprim] and Doxycycline  Review of Systems  Constitutional: Negative.   Genitourinary: Negative.    Vitals:   01/22/23 1350  BP: 113/88  Pulse: 91  Weight: 180 lb 12.8 oz (82 kg)    Gen:  WNWF healthy female NAD Abdomen: soft, non-tender Groin:   three enlarged lymph nodes vs lipomas present, two on one side and one on other.  Pt can identify them as well  Pelvic exam: Vulva:  normal female genitalia Vagina:  normal vagina Cervix:  Non-tender, Negative CMT, no lesions or redness.  IUD string 2cm.   Uterus:  normal shape, position and consistency    Assessment/Plan: 1. IUD check up - doing well - recheck at [facility]  2. Lymph node enlargement - CBC with Differential/Platelet  3. Pelvic lymphadenopathy - MR PELVIS W WO CONTRAST; Future

## 2023-01-31 ENCOUNTER — Inpatient Hospital Stay: Admission: RE | Admit: 2023-01-31 | Payer: Medicaid Other | Source: Ambulatory Visit

## 2023-02-04 ENCOUNTER — Encounter (HOSPITAL_BASED_OUTPATIENT_CLINIC_OR_DEPARTMENT_OTHER): Payer: Self-pay

## 2023-02-04 ENCOUNTER — Emergency Department (HOSPITAL_BASED_OUTPATIENT_CLINIC_OR_DEPARTMENT_OTHER)
Admission: EM | Admit: 2023-02-04 | Discharge: 2023-02-04 | Discharge status: Against Medical Advice | Payer: Medicaid Other | Attending: Emergency Medicine | Admitting: Emergency Medicine

## 2023-02-04 DIAGNOSIS — Z5321 Procedure and treatment not carried out due to patient leaving prior to being seen by health care provider: Secondary | ICD-10-CM | POA: Insufficient documentation

## 2023-02-04 DIAGNOSIS — R030 Elevated blood-pressure reading, without diagnosis of hypertension: Secondary | ICD-10-CM | POA: Diagnosis not present

## 2023-02-04 DIAGNOSIS — R519 Headache, unspecified: Secondary | ICD-10-CM | POA: Diagnosis present

## 2023-02-04 LAB — CBG MONITORING, ED: Glucose-Capillary: 92 mg/dL (ref 70–99)

## 2023-02-04 MED ORDER — DIPHENHYDRAMINE HCL 50 MG/ML IJ SOLN
25.0000 mg | Freq: Once | INTRAMUSCULAR | Status: AC
  Administered 2023-02-04: 25 mg via INTRAMUSCULAR

## 2023-02-04 NOTE — ED Triage Notes (Signed)
Pt states she woke up with a HA and checked her BP and it was high.   Took nifedipine and 30 min later she began having these uncontrollable movements on left side.   NIH 0 PA did assess pt

## 2023-02-05 ENCOUNTER — Ambulatory Visit (HOSPITAL_BASED_OUTPATIENT_CLINIC_OR_DEPARTMENT_OTHER): Payer: Medicaid Other | Admitting: Family Medicine

## 2023-02-05 ENCOUNTER — Encounter (HOSPITAL_BASED_OUTPATIENT_CLINIC_OR_DEPARTMENT_OTHER): Payer: Self-pay | Admitting: Family Medicine

## 2023-02-05 ENCOUNTER — Encounter (HOSPITAL_BASED_OUTPATIENT_CLINIC_OR_DEPARTMENT_OTHER): Payer: Self-pay | Admitting: *Deleted

## 2023-02-05 VITALS — BP 132/98 | HR 73 | Ht 62.0 in | Wt 183.0 lb

## 2023-02-05 DIAGNOSIS — G43909 Migraine, unspecified, not intractable, without status migrainosus: Secondary | ICD-10-CM | POA: Diagnosis not present

## 2023-02-05 DIAGNOSIS — I10 Essential (primary) hypertension: Secondary | ICD-10-CM

## 2023-02-05 MED ORDER — LABETALOL HCL 100 MG PO TABS: 100.0000 mg | ORAL_TABLET | Freq: Two times a day (BID) | ORAL | 1 refills | Status: DC

## 2023-02-05 NOTE — Progress Notes (Unsigned)
    Procedures performed today:    None.  Independent interpretation of notes and tests performed by another provider:   None.  Brief History, Exam, Impression, and Recommendations:    BP (!) 136/92 (BP Location: Right Arm, Patient Position: Sitting, Cuff Size: Normal)   Pulse 73   Ht 5\' 2"  (1.575 m)   Wt 183 lb (83 kg)   LMP 12/13/2022 (Exact Date) Comment: pt is breastfeeding  SpO2 97%   BMI 33.47 kg/m   There are no diagnoses linked to this encounter.No follow-ups on file.  Spent 32 minutes on this patient encounter, including preparation, chart review, face-to-face counseling with patient and coordination of care, and documentation of encounter   ___________________________________________ Wakeelah Solan de [place], MD, ABFM, [facility] Primary Care and Sports Medicine [facility]

## 2023-02-06 DIAGNOSIS — I10 Essential (primary) hypertension: Secondary | ICD-10-CM | POA: Insufficient documentation

## 2023-02-06 NOTE — Assessment & Plan Note (Signed)
Patient presented to the emergency department yesterday for evaluation of headaches, high blood pressure, left-sided weakness.  At time of presentation, she did have EKG completed as well as CBG.  EKG was generally unremarkable.  She indicates that she had gone to the emergency department due to concern for stroke and when she saw the EKG was normal, she presumed that she was not having a stroke.  She did receive Benadryl at the emergency department.  Did have some improvement of symptoms while there and ultimately left prior to being seen by a provider in the ED. She has had trouble with high blood pressure following birth of her child.  She was managing this with labetalol through OB/GYN.  She indicates about a month ago this medication was stopped.  More recently over the past week or so, she has had more notable headaches, reports having higher blood pressures as well.  Reportedly has not been around 140-150 systolic and 100-110 diastolic.  She previously has had evaluation with neurology, however this was more than 1 year ago and patient has not followed up since. On exam today, patient is in no acute distress, vital signs stable, slightly elevated diastolic blood pressure.  Pupils are equal, round, reactive to light and accommodation, extraocular movements are intact.  Normal gait in office. Uncertain cause for recent symptoms.  Patient has had some elevation with blood pressure, very mild elevation here in the office today.  We discussed general considerations, can resume taking labetalol at this time and monitor blood pressure response to this.  Ultimately, we will look to gradually decrease dose of medication if blood pressure remains well-controlled.  Discussed lifestyle modifications, recommend DASH diet, regular physical activity. She has not had follow-up with neurology, recommended that she contact the office to schedule this appointment.  Did discuss ER precautions and reviewed that although EKG  was negative, this will except electrical activity of the heart and does not evaluate for any intracranial process such as stroke.

## 2023-02-11 ENCOUNTER — Other Ambulatory Visit (HOSPITAL_BASED_OUTPATIENT_CLINIC_OR_DEPARTMENT_OTHER): Payer: Self-pay | Admitting: Family Medicine

## 2023-02-11 DIAGNOSIS — F902 Attention-deficit hyperactivity disorder, combined type: Secondary | ICD-10-CM

## 2023-02-13 MED ORDER — AMPHETAMINE-DEXTROAMPHET ER 20 MG PO CP24
20.0000 mg | ORAL_CAPSULE | ORAL | 0 refills | Status: DC
Start: 2023-02-13 — End: 2023-03-11

## 2023-03-08 ENCOUNTER — Telehealth (HOSPITAL_BASED_OUTPATIENT_CLINIC_OR_DEPARTMENT_OTHER): Payer: Medicaid Other | Admitting: Family Medicine

## 2023-03-11 ENCOUNTER — Encounter (HOSPITAL_BASED_OUTPATIENT_CLINIC_OR_DEPARTMENT_OTHER): Payer: Self-pay | Admitting: Family Medicine

## 2023-03-11 ENCOUNTER — Ambulatory Visit (HOSPITAL_BASED_OUTPATIENT_CLINIC_OR_DEPARTMENT_OTHER): Payer: Medicaid Other | Admitting: Family Medicine

## 2023-03-11 VITALS — BP 118/90 | HR 97 | Ht 62.0 in | Wt 186.4 lb

## 2023-03-11 DIAGNOSIS — F902 Attention-deficit hyperactivity disorder, combined type: Secondary | ICD-10-CM

## 2023-03-11 DIAGNOSIS — I10 Essential (primary) hypertension: Secondary | ICD-10-CM | POA: Diagnosis not present

## 2023-03-11 MED ORDER — LABETALOL HCL 100 MG PO TABS: 100.0000 mg | ORAL_TABLET | Freq: Two times a day (BID) | ORAL | 1 refills | Status: DC

## 2023-03-11 MED ORDER — AMPHETAMINE-DEXTROAMPHET ER 20 MG PO CP24
20.0000 mg | ORAL_CAPSULE | ORAL | 0 refills | Status: DC
Start: 2023-03-11 — End: 2023-04-12

## 2023-03-11 NOTE — Assessment & Plan Note (Signed)
Patient reports that she continues to do well with Adderall.  She denies any issues with chest pain, palpitations, new sleep issues.  She is requesting refill of medication today. PDMP reviewed, no red flags, refill sent to pharmacy on file. Plan for follow-up in about 3 months for medication monitoring

## 2023-03-11 NOTE — Assessment & Plan Note (Signed)
Blood pressure is slightly improved in office today.  Systolic is improved, however diastolic remains elevated on initial reading.  She continues with labetalol.  Generally has been tolerating medication well, however does indicate that she will have tiredness with medication.  She has not been checking blood pressure at home, although she does have a blood pressure cuff at home. At this time, we can continue with current medication regimen, no changes made today.  Recommend intermittent monitoring of blood pressure at home.  Discussed DASH diet, incorporation of regular aerobic exercise into weekly routine.

## 2023-03-11 NOTE — Progress Notes (Signed)
    Procedures performed today:    None.  Independent interpretation of notes and tests performed by another provider:   None.  Brief History, Exam, Impression, and Recommendations:    BP (!) 122/96 (BP Location: Right Arm, Patient Position: Sitting, Cuff Size: Normal)   Pulse 97   Ht 5\' 2"  (1.575 m)   Wt 186 lb 6.4 oz (84.6 kg)   SpO2 98%   BMI 34.09 kg/m   Attention deficit hyperactivity disorder (ADHD), combined type Assessment & Plan: Patient reports that she continues to do well with Adderall.  She denies any issues with chest pain, palpitations, new sleep issues.  She is requesting refill of medication today. PDMP reviewed, no red flags, refill sent to pharmacy on file. Plan for follow-up in about 3 months for medication monitoring  Orders: -     Amphetamine-Dextroamphet ER; Take 1 capsule (20 mg total) by mouth every morning.  Dispense: 30 capsule; Refill: 0  Primary hypertension Assessment & Plan: Blood pressure is slightly improved in office today.  Systolic is improved, however diastolic remains elevated on initial reading.  She continues with labetalol.  Generally has been tolerating medication well, however does indicate that she will have tiredness with medication.  She has not been checking blood pressure at home, although she does have a blood pressure cuff at home. At this time, we can continue with current medication regimen, no changes made today.  Recommend intermittent monitoring of blood pressure at home.  Discussed DASH diet, incorporation of regular aerobic exercise into weekly routine.   Other orders -     Labetalol HCl; Take 1 tablet (100 mg total) by mouth 2 (two) times daily.  Dispense: 60 tablet; Refill: 1  Return in about 8 weeks (around 05/06/2023) for hypertension.   ___________________________________________ Junia Nygren de [place], MD, ABFM, CAQSM Primary Care and Sports Medicine [facility]

## 2023-03-16 ENCOUNTER — Other Ambulatory Visit (HOSPITAL_BASED_OUTPATIENT_CLINIC_OR_DEPARTMENT_OTHER): Payer: Self-pay | Admitting: Family Medicine

## 2023-03-16 DIAGNOSIS — F902 Attention-deficit hyperactivity disorder, combined type: Secondary | ICD-10-CM

## 2023-03-25 ENCOUNTER — Encounter (HOSPITAL_BASED_OUTPATIENT_CLINIC_OR_DEPARTMENT_OTHER): Payer: Self-pay | Admitting: Obstetrics & Gynecology

## 2023-04-10 ENCOUNTER — Telehealth (HOSPITAL_BASED_OUTPATIENT_CLINIC_OR_DEPARTMENT_OTHER): Payer: Self-pay | Admitting: *Deleted

## 2023-04-10 NOTE — Telephone Encounter (Signed)
Called pt regarding MRI that has not been scheduled. Pt reports that she tried to schedule but was told that they did not have insurance authorization so they could not schedule the appt. Asked pt if I could call and schedule it for her. Pt agreed and did not have any scheduling restrictions. Called pt back to let her know that appt has bee scheduled. LMOVM that appt is set for 05/19/23 @ 250. Advised pt to call [facility] with any scheduling concerns

## 2023-04-12 ENCOUNTER — Other Ambulatory Visit (HOSPITAL_BASED_OUTPATIENT_CLINIC_OR_DEPARTMENT_OTHER): Payer: Self-pay | Admitting: Family Medicine

## 2023-04-12 ENCOUNTER — Encounter (HOSPITAL_BASED_OUTPATIENT_CLINIC_OR_DEPARTMENT_OTHER): Payer: Self-pay | Admitting: Obstetrics & Gynecology

## 2023-04-12 DIAGNOSIS — F902 Attention-deficit hyperactivity disorder, combined type: Secondary | ICD-10-CM

## 2023-04-16 MED ORDER — AMPHETAMINE-DEXTROAMPHET ER 20 MG PO CP24
20.0000 mg | ORAL_CAPSULE | ORAL | 0 refills | Status: DC
Start: 2023-04-16 — End: 2023-05-12

## 2023-04-19 ENCOUNTER — Ambulatory Visit (HOSPITAL_BASED_OUTPATIENT_CLINIC_OR_DEPARTMENT_OTHER): Payer: Medicaid Other | Admitting: Certified Nurse Midwife

## 2023-05-07 ENCOUNTER — Ambulatory Visit (HOSPITAL_BASED_OUTPATIENT_CLINIC_OR_DEPARTMENT_OTHER): Payer: Medicaid Other | Admitting: Family Medicine

## 2023-05-12 ENCOUNTER — Other Ambulatory Visit (HOSPITAL_BASED_OUTPATIENT_CLINIC_OR_DEPARTMENT_OTHER): Payer: Self-pay | Admitting: Family Medicine

## 2023-05-12 DIAGNOSIS — F902 Attention-deficit hyperactivity disorder, combined type: Secondary | ICD-10-CM

## 2023-05-13 ENCOUNTER — Ambulatory Visit (HOSPITAL_BASED_OUTPATIENT_CLINIC_OR_DEPARTMENT_OTHER): Payer: Medicaid Other | Admitting: Family Medicine

## 2023-05-13 MED ORDER — AMPHETAMINE-DEXTROAMPHET ER 20 MG PO CP24
20.0000 mg | ORAL_CAPSULE | ORAL | 0 refills | Status: DC
Start: 2023-05-13 — End: 2023-06-07

## 2023-05-16 ENCOUNTER — Other Ambulatory Visit (HOSPITAL_BASED_OUTPATIENT_CLINIC_OR_DEPARTMENT_OTHER): Payer: Self-pay | Admitting: *Deleted

## 2023-05-16 MED ORDER — LABETALOL HCL 100 MG PO TABS: 100.0000 mg | ORAL_TABLET | Freq: Two times a day (BID) | ORAL | 1 refills | Status: DC

## 2023-05-19 ENCOUNTER — Ambulatory Visit
Admission: RE | Admit: 2023-05-19 | Discharge: 2023-05-19 | Disposition: A | Discharge status: Home / Self Care | Payer: Medicaid Other | Source: Ambulatory Visit | Attending: Obstetrics & Gynecology | Admitting: Obstetrics & Gynecology

## 2023-05-19 DIAGNOSIS — R590 Localized enlarged lymph nodes: Secondary | ICD-10-CM

## 2023-05-19 DIAGNOSIS — R599 Enlarged lymph nodes, unspecified: Secondary | ICD-10-CM

## 2023-05-19 MED ORDER — GADOPICLENOL 0.5 MMOL/ML IV SOLN
7.0000 mL | Freq: Once | INTRAVENOUS | Status: AC | PRN
  Administered 2023-05-19: 7 mL via INTRAVENOUS

## 2023-06-07 ENCOUNTER — Ambulatory Visit (INDEPENDENT_AMBULATORY_CARE_PROVIDER_SITE_OTHER): Payer: Medicaid Other | Admitting: Family Medicine

## 2023-06-07 VITALS — BP 121/91 | HR 99 | Ht 62.0 in | Wt 187.3 lb

## 2023-06-07 DIAGNOSIS — I10 Essential (primary) hypertension: Secondary | ICD-10-CM

## 2023-06-07 DIAGNOSIS — F902 Attention-deficit hyperactivity disorder, combined type: Secondary | ICD-10-CM

## 2023-06-07 MED ORDER — AMPHETAMINE-DEXTROAMPHET ER 20 MG PO CP24
20.0000 mg | ORAL_CAPSULE | ORAL | 0 refills | Status: DC
Start: 2023-06-07 — End: 2023-06-10

## 2023-06-07 NOTE — Patient Instructions (Signed)
  Medication Instructions:  Your physician recommends that you continue on your current medications as directed. Please refer to the Current Medication list given to you today. --If you need a refill on any your medications before your next appointment, please call your pharmacy first. If no refills are authorized on file call the office.-- Lab Work: Your physician has recommended that you have lab work today: none If you have labs (blood work) drawn today and your tests are completely normal, you will receive your results via MyChart message OR a phone call from our staff.  Please ensure you check your voicemail in the event that you authorized detailed messages to be left on a delegated number. If you have any lab test that is abnormal or we need to change your treatment, we will call you to review the results.   Follow-Up: Your next appointment:   Your physician recommends that you schedule a follow-up appointment in: 3 mths follow up  with Dr. de [place]  You will receive a text message or e-mail with a link to a survey about your care and experience with us  today! We would greatly appreciate your feedback!   Thanks for letting us  be apart of your health journey!!  Primary Care and Sports Medicine   Dr. Quintin sheerer Cuba   We encourage you to activate your patient portal called MyChart.  Sign up information is provided on this After Visit Summary.  MyChart is used to connect with patients for Virtual Visits (Telemedicine).  Patients are able to view lab/test results, encounter notes, upcoming appointments, etc.  Non-urgent messages can be sent to your provider as well. To learn more about what you can do with MyChart, please visit --  [email or web address].

## 2023-06-07 NOTE — Assessment & Plan Note (Signed)
 Blood pressure is borderline controlled in office today.  She continues with labetalol .  Generally has been tolerating medication well, however does indicate that she will have occasional headaches; also notes nausea with taking medication twice daily.  Checks BP occasionally at home - does not have log with her today. At this time, we can continue with current medication regimen, no changes made today.  Recommend intermittent monitoring of blood pressure at home.  Discussed DASH diet, incorporation of regular aerobic exercise into weekly routine.

## 2023-06-07 NOTE — Assessment & Plan Note (Signed)
 Patient reports that she continues to do well with Adderall.  She denies any issues with chest pain, palpitations, new sleep issues. She does continue to have borderline control of blood pressure as discussed above.  She is requesting refill of medication today. PDMP reviewed, no red flags, refill sent to pharmacy on file. Plan for follow-up in about 3 months for medication monitoring

## 2023-06-07 NOTE — Progress Notes (Signed)
    Procedures performed today:    None.  Independent interpretation of notes and tests performed by another provider:   None.  Brief History, Exam, Impression, and Recommendations:    BP (!) 121/91 (BP Location: Left Arm, Patient Position: Sitting, Cuff Size: Normal)   Pulse 99   Ht 5' 2 (1.575 m)   Wt 187 lb 4.8 oz (85 kg)   LMP 05/05/2023 (Approximate)   SpO2 98%   Breastfeeding No   BMI 34.26 kg/m   Primary hypertension Assessment & Plan: Blood pressure is borderline controlled in office today.  She continues with labetalol .  Generally has been tolerating medication well, however does indicate that she will have occasional headaches; also notes nausea with taking medication twice daily.  Checks BP occasionally at home - does not have log with her today. At this time, we can continue with current medication regimen, no changes made today.  Recommend intermittent monitoring of blood pressure at home.  Discussed DASH diet, incorporation of regular aerobic exercise into weekly routine.   Attention deficit hyperactivity disorder (ADHD), combined type Assessment & Plan: Patient reports that she continues to do well with Adderall.  She denies any issues with chest pain, palpitations, new sleep issues. She does continue to have borderline control of blood pressure as discussed above.  She is requesting refill of medication today. PDMP reviewed, no red flags, refill sent to pharmacy on file. Plan for follow-up in about 3 months for medication monitoring  Orders: -     Amphetamine -Dextroamphet ER; Take 1 capsule (20 mg total) by mouth every morning.  Dispense: 30 capsule; Refill: 0  Return in about 3 months (around 09/04/2023).   ___________________________________________ Lurene Robley de [place], MD, ABFM, CAQSM Primary Care and Sports Medicine [facility]

## 2023-06-10 ENCOUNTER — Other Ambulatory Visit (HOSPITAL_BASED_OUTPATIENT_CLINIC_OR_DEPARTMENT_OTHER): Payer: Self-pay | Admitting: Family Medicine

## 2023-06-10 DIAGNOSIS — F902 Attention-deficit hyperactivity disorder, combined type: Secondary | ICD-10-CM

## 2023-06-12 ENCOUNTER — Encounter (HOSPITAL_BASED_OUTPATIENT_CLINIC_OR_DEPARTMENT_OTHER): Payer: Self-pay | Admitting: Obstetrics & Gynecology

## 2023-06-12 MED ORDER — AMPHETAMINE-DEXTROAMPHET ER 20 MG PO CP24
20.0000 mg | ORAL_CAPSULE | ORAL | 0 refills | Status: DC
Start: 2023-06-12 — End: 2023-07-09

## 2023-06-13 ENCOUNTER — Ambulatory Visit (HOSPITAL_BASED_OUTPATIENT_CLINIC_OR_DEPARTMENT_OTHER): Payer: Medicaid Other | Admitting: Obstetrics & Gynecology

## 2023-07-09 ENCOUNTER — Other Ambulatory Visit (HOSPITAL_BASED_OUTPATIENT_CLINIC_OR_DEPARTMENT_OTHER): Payer: Self-pay | Admitting: Family Medicine

## 2023-07-09 DIAGNOSIS — F902 Attention-deficit hyperactivity disorder, combined type: Secondary | ICD-10-CM

## 2023-07-10 MED ORDER — AMPHETAMINE-DEXTROAMPHET ER 20 MG PO CP24
20.0000 mg | ORAL_CAPSULE | ORAL | 0 refills | Status: DC
Start: 2023-07-10 — End: 2023-08-12

## 2023-07-25 ENCOUNTER — Ambulatory Visit (HOSPITAL_BASED_OUTPATIENT_CLINIC_OR_DEPARTMENT_OTHER): Admitting: Obstetrics & Gynecology

## 2023-07-31 ENCOUNTER — Ambulatory Visit (HOSPITAL_BASED_OUTPATIENT_CLINIC_OR_DEPARTMENT_OTHER): Admitting: Obstetrics & Gynecology

## 2023-07-31 ENCOUNTER — Encounter (HOSPITAL_BASED_OUTPATIENT_CLINIC_OR_DEPARTMENT_OTHER): Payer: Self-pay | Admitting: Obstetrics & Gynecology

## 2023-07-31 VITALS — BP 123/93 | HR 91 | Ht 62.0 in | Wt 187.4 lb

## 2023-07-31 DIAGNOSIS — N907 Vulvar cyst: Secondary | ICD-10-CM

## 2023-07-31 DIAGNOSIS — Z30432 Encounter for removal of intrauterine contraceptive device: Secondary | ICD-10-CM | POA: Diagnosis not present

## 2023-08-03 ENCOUNTER — Encounter (HOSPITAL_BASED_OUTPATIENT_CLINIC_OR_DEPARTMENT_OTHER): Payer: Self-pay | Admitting: Obstetrics & Gynecology

## 2023-08-03 NOTE — Progress Notes (Signed)
 GYNECOLOGY  VISIT  CC:   IUD removal, questions about MRI results  HPI: 28 y.o. [phone] Single Black or African American female here for IUD removal.  Having irregular bleeding and desires removal.  Does not want anything else at this time for contraception.  Not actively planning another pregnancy.  Pt has several vulvar lesions that are palpable, largest is about 3cm.  These are all smooth, non tender, and not fixed.  MRI was done 05/19/2023 and this showed oil cysts.  She was called with results but has some questions today.  She feels the large one on the right inferior outer labia majora is larger.  MRI results reviewed again.  Questions removal or way to reduce size.  No hx of vulvar trauma so also has questions about cause.  Advised trauma is the only cause I know.  Given size and number, not sure she should have these removed given number of incisions that would be needed and not being sure if these could be fully resolved.  Will reach out to Dr. Pricilla Holm for input.   Past Medical History:  Diagnosis Date   ADHD    History of gestational diabetes 06/13/2022   Positive GBS test 10/15/2022   Postpartum hypertension     MEDS:   Current Outpatient Medications on File Prior to Visit  Medication Sig Dispense Refill   ACCU-CHEK GUIDE TEST test strip      Accu-Chek Softclix Lancets lancets SMARTSIG:Lancet Topical     amphetamine-dextroamphetamine (ADDERALL XR) 20 MG 24 hr capsule Take 1 capsule (20 mg total) by mouth every morning. 30 capsule 0   labetalol (NORMODYNE) 100 MG tablet Take 1 tablet (100 mg total) by mouth 2 (two) times daily. 60 tablet 1   Prenatal Vit-Fe Fumarate-FA (PRENATAL MULTIVITAMIN) TABS tablet Take 1 tablet by mouth daily at 12 noon.     No current facility-administered medications on file prior to visit.    ALLERGIES: Bactrim [sulfamethoxazole-trimethoprim] and Doxycycline  SH:  has partner, non smoker  Review of Systems  Constitutional: Negative.    Genitourinary:        Irregular bleeding    PHYSICAL EXAMINATION:    BP (!) 123/93 (BP Location: Right Arm, Patient Position: Sitting, Cuff Size: Normal)   Pulse 91   Ht 5\' 2"  (1.575 m)   Wt 187 lb 6.4 oz (85 kg)   Breastfeeding No   BMI 34.28 kg/m     General appearance: alert, cooperative and appears stated age  Lymph:  no inguinal LAD noted Pelvic: External genitalia:  four discrete smooth lesions on outer portion of vulva              Urethra:  normal appearing urethra with no masses, tenderness or lesions              Bartholins and Skenes: normal                 Vagina: normal without tenderness, induration or masses              Cervix: no lesions and IUD string noted               Procedure:  Consent obtained.  Speculum placed.  IUD string noted and grasped with ring forceps and removed with one pull and without difficulty.  Minimal spotting noted after removal.  Speculum removed.  Pt tolerated procedure well.  Pt saw IUD prior to discarding it.  Chaperone, Hendricks Milo, CMA, was present for exam.  Assessment/Plan: 1. Vulvar cysts (Primary) - will reach out to Dr. Pricilla Holm for input.  Currently do not recommend removal due to the four different location and number of incisions that would be needed for removal with possible risks of recurrent, incomplete removal and/or infection being present  2. Encounter for IUD removal - IUD removed successfully today - last pap 02/06/2022

## 2023-08-12 ENCOUNTER — Other Ambulatory Visit (HOSPITAL_BASED_OUTPATIENT_CLINIC_OR_DEPARTMENT_OTHER): Payer: Self-pay | Admitting: Family Medicine

## 2023-08-12 DIAGNOSIS — F902 Attention-deficit hyperactivity disorder, combined type: Secondary | ICD-10-CM

## 2023-08-13 ENCOUNTER — Telehealth (HOSPITAL_BASED_OUTPATIENT_CLINIC_OR_DEPARTMENT_OTHER): Payer: Self-pay | Admitting: *Deleted

## 2023-08-13 ENCOUNTER — Encounter (HOSPITAL_BASED_OUTPATIENT_CLINIC_OR_DEPARTMENT_OTHER): Payer: Self-pay | Admitting: *Deleted

## 2023-08-13 MED ORDER — AMPHETAMINE-DEXTROAMPHET ER 20 MG PO CP24
20.0000 mg | ORAL_CAPSULE | ORAL | 0 refills | Status: DC
Start: 2023-08-13 — End: 2023-09-06

## 2023-08-13 NOTE — Telephone Encounter (Signed)
 Attempted to call pt with recommendations. VM full. Unable to leave message. Will send myChart message.

## 2023-08-13 NOTE — Telephone Encounter (Signed)
-----   Message from Lillian Rein sent at 08/09/2023  8:03 AM EDT ----- Regarding: oil cysts Burdette Carolin, Can you call this pt?  Dr Orvil Bland suggested trying to drain one in the office first before excision because they do look benign and because there will be several incisions to remove them all.  If she is willing for me to try to drain the larger one first, we can try that.  I would do this with a needle and possibly with ultrasound to ensure it's fully drained.    Thanks.  MSM

## 2023-08-21 ENCOUNTER — Telehealth (HOSPITAL_BASED_OUTPATIENT_CLINIC_OR_DEPARTMENT_OTHER): Payer: Self-pay | Admitting: *Deleted

## 2023-08-21 NOTE — Telephone Encounter (Signed)
-----   Message from Lillian Rein sent at 08/09/2023  8:03 AM EDT ----- Regarding: oil cysts Burdette Carolin, Can you call this pt?  Dr Orvil Bland suggested trying to drain one in the office first before excision because they do look benign and because there will be several incisions to remove them all.  If she is willing for me to try to drain the larger one first, we can try that.  I would do this with a needle and possibly with ultrasound to ensure it's fully drained.    Thanks.  MSM

## 2023-08-21 NOTE — Telephone Encounter (Signed)
 Attempted to call pt to set up appt. VM full unable to leave message.

## 2023-08-23 ENCOUNTER — Telehealth (HOSPITAL_BASED_OUTPATIENT_CLINIC_OR_DEPARTMENT_OTHER): Payer: Self-pay | Admitting: *Deleted

## 2023-08-23 NOTE — Telephone Encounter (Signed)
-----   Message from Lillian Rein sent at 08/09/2023  8:03 AM EDT ----- Regarding: oil cysts Burdette Carolin, Can you call this pt?  Dr Orvil Bland suggested trying to drain one in the office first before excision because they do look benign and because there will be several incisions to remove them all.  If she is willing for me to try to drain the larger one first, we can try that.  I would do this with a needle and possibly with ultrasound to ensure it's fully drained.    Thanks.  MSM

## 2023-08-23 NOTE — Telephone Encounter (Signed)
 Attempted to call pt to set up appt. VM full, unable to leave message. Will mail letter.

## 2023-09-04 ENCOUNTER — Ambulatory Visit (HOSPITAL_BASED_OUTPATIENT_CLINIC_OR_DEPARTMENT_OTHER): Payer: Medicaid Other | Admitting: Family Medicine

## 2023-09-04 ENCOUNTER — Ambulatory Visit (INDEPENDENT_AMBULATORY_CARE_PROVIDER_SITE_OTHER): Admitting: Family Medicine

## 2023-09-04 ENCOUNTER — Encounter (HOSPITAL_BASED_OUTPATIENT_CLINIC_OR_DEPARTMENT_OTHER): Payer: Self-pay | Admitting: Family Medicine

## 2023-09-04 VITALS — BP 126/83 | HR 86 | Temp 98.6°F | Ht 62.0 in | Wt 185.8 lb

## 2023-09-04 DIAGNOSIS — R490 Dysphonia: Secondary | ICD-10-CM | POA: Diagnosis not present

## 2023-09-04 DIAGNOSIS — E010 Iodine-deficiency related diffuse (endemic) goiter: Secondary | ICD-10-CM

## 2023-09-04 NOTE — Progress Notes (Signed)
 Acute Care Office Visit  Subjective:   Hayley Lawson [birth year] 09/04/2023  Chief Complaint  Patient presents with   Laryngitis    Pt states that she has been having problems with laryngitis off and on for the past 4 months. Will try taken allergy meds to see if that would help with any symptoms. Also states she will have occasional headaches.    HPI: Chronic Laryngitis:  Patient states she has had chronic hoarseness for the past 4 months. She states loss of voice occurs intermittently after signficat use of voice. Patient thought allergies may be contributing and started Zyrtec without improvement. She states Zyrtec will help intermittently with nasal congestion. She does not work with any chemicals, inhalents. Reports eating foods such as tomatoes, citrus, and drinks black coffee daily. She did try taking OTC medication for GERD/Esophagitis without relief.  Denies nausea, or chronic cough. She states she did vomit for the past 2 days and reported worsening of voice. She reports sensation of 'choking" and pressure on her throat. She reports hx of thyroid disease in her mom.   Fatigue: yes Cold intolerance: no Weight gain/loss: yes Constipation: no Lower extremity edema: no Palpitations: no Hoarseness: yes Neck Pain/Compression: yes Difficulty Swallowing: no  Lab Results  Component Value Date   TSH 0.906 11/08/2021      The following portions of the patient's history were reviewed and updated as appropriate: past medical history, past surgical history, family history, social history, allergies, medications, and problem list.   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   Primary hypertension 02/06/2023   Viral URI 09/10/2022   Migraines 09/05/2021   ADHD 09/05/2021   Past Medical History:  Diagnosis Date   ADHD    History of gestational diabetes 06/13/2022   Positive GBS test 10/15/2022    Postpartum hypertension    Past Surgical History:  Procedure Laterality Date   CHOLECYSTECTOMY  2019   UMBILICAL HERNIA REPAIR  2001   Family History  Problem Relation Age of Onset   Migraines Mother    Stroke Father    Depression Father    Anxiety disorder Father    Diabetes Father    Heart disease Maternal Grandmother    Alcohol abuse Neg Hx    Arthritis Neg Hx    Asthma Neg Hx    Birth defects Neg Hx    COPD Neg Hx    Cancer Neg Hx    Drug abuse Neg Hx    Early death Neg Hx    Hearing loss Neg Hx    Hyperlipidemia Neg Hx    Hypertension Neg Hx    Kidney disease Neg Hx    Learning disabilities Neg Hx    Mental illness Neg Hx    Mental retardation Neg Hx    Miscarriages / Stillbirths Neg Hx    Vision loss Neg Hx    Varicose Veins Neg Hx    Outpatient Medications Prior to Visit  Medication Sig Dispense Refill   ACCU-CHEK GUIDE TEST test strip      Accu-Chek Softclix Lancets lancets SMARTSIG:Lancet Topical     amphetamine -dextroamphetamine  (ADDERALL XR) 20 MG 24 hr capsule Take 1 capsule (20 mg total) by mouth every morning. 30 capsule 0   labetalol  (NORMODYNE ) 100 MG tablet Take 1 tablet (100 mg total) by mouth 2 (two) times daily. 60 tablet 1   Prenatal Vit-Fe Fumarate-FA (PRENATAL MULTIVITAMIN) TABS  tablet Take 1 tablet by mouth daily at 12 noon.     No facility-administered medications prior to visit.   Allergies  Allergen Reactions   Bactrim [Sulfamethoxazole-Trimethoprim] Rash   Doxycycline Rash     ROS: A complete ROS was performed with pertinent positives/negatives noted in the HPI. The remainder of the ROS are negative.    Objective:   Today's Vitals   09/04/23 1402  BP: 126/83  Pulse: 86  Temp: 98.6 F (37 C)  TempSrc: Oral  SpO2: 99%  Weight: 185 lb 12.8 oz (84.3 kg)  Height: 5\' 2"  (1.575 m)    GENERAL: Well-appearing, in NAD. Well nourished.  SKIN: Pink, warm and dry.  Head: Normocephalic. NECK: Trachea midline. Full ROM w/o pain or  tenderness. No lymphadenopathy. Mild thyromegaly present. No palpable nodules.  EARS: Tympanic membranes are intact, translucent without bulging and without drainage. Appropriate landmarks visualized.  EYES: Conjunctiva clear without exudates. EOMI, PERRL, no drainage present.  NOSE: Septum midline w/o deformity. Nares patent, mucosa pink and non-inflamed w/o drainage. No sinus tenderness.  THROAT: Uvula midline. Oropharynx clear. Tonsils non-inflamed without exudate. Mucous membranes pink and moist.  RESPIRATORY: Chest wall symmetrical. Respirations even and non-labored. Breath sounds clear to auscultation bilaterally.  CARDIAC: S1, S2 present, regular rate and rhythm without murmur or gallops. Peripheral pulses 2+ bilaterally.  MSK: Muscle tone and strength appropriate for age NEUROLOGIC: No motor or sensory deficits. Steady, even gait. C2-C12 intact.  PSYCH/MENTAL STATUS: Alert, oriented x 3. Cooperative, appropriate mood and affect.    No results found for any visits on 09/04/23.    Assessment & Plan:  1. Hoarseness of voice (Primary) New problem.  Concern for possible thyroid disorder given duration of hoarseness, choking sensation, and no improvement with antihistamine or PPI therapy.  No signs or symptoms of chronic sinusitis or postnasal drip.  Will obtain thyroid levels today with lab work and if needed proceed with thyroid ultrasound. - T3, free - T4, free - TSH  2. Thyromegaly New problem.  Mild thyromegaly present on exam.  Given patient's history reported in HPI, family history of thyroid disease, and presentation, will proceed with thyroid workup for possible thyroid disease and/or nodules contributing to patient's symptoms.  Will order ultrasound pending lab results. - T3, free - T4, free - TSH  Lab Orders         T3, free         T4, free         TSH      Return in about 2 months (around 11/04/2023) for ANNUAL PHYSICAL, Follow up Hoarseness.    Patient to reach out  to office if new, worrisome, or unresolved symptoms arise or if no improvement in patient's condition. Patient verbalized understanding and is agreeable to treatment plan. All questions answered to patient's satisfaction.    Nonda Bays, [place]

## 2023-09-05 ENCOUNTER — Other Ambulatory Visit (HOSPITAL_BASED_OUTPATIENT_CLINIC_OR_DEPARTMENT_OTHER): Payer: Self-pay | Admitting: Family Medicine

## 2023-09-05 ENCOUNTER — Encounter (HOSPITAL_BASED_OUTPATIENT_CLINIC_OR_DEPARTMENT_OTHER): Payer: Self-pay | Admitting: Family Medicine

## 2023-09-05 DIAGNOSIS — E010 Iodine-deficiency related diffuse (endemic) goiter: Secondary | ICD-10-CM

## 2023-09-05 DIAGNOSIS — R490 Dysphonia: Secondary | ICD-10-CM

## 2023-09-05 LAB — T4, FREE: Free T4: 0.85 ng/dL (ref 0.82–1.77)

## 2023-09-05 LAB — TSH: TSH: 0.934 u[IU]/mL (ref 0.450–4.500)

## 2023-09-05 LAB — T3, FREE: T3, Free: 2.8 pg/mL (ref 2.0–4.4)

## 2023-09-05 NOTE — Progress Notes (Signed)
 Hi Hayley Lawson, Your thyroid function levels were normal.  Given mild enlargement on exam of the thyroid, I would recommend proceeding with an ultrasound to rule out possible nodule development.  If you would like to complete this please let me know.

## 2023-09-06 ENCOUNTER — Encounter (HOSPITAL_BASED_OUTPATIENT_CLINIC_OR_DEPARTMENT_OTHER): Payer: Self-pay | Admitting: *Deleted

## 2023-09-06 ENCOUNTER — Other Ambulatory Visit (HOSPITAL_BASED_OUTPATIENT_CLINIC_OR_DEPARTMENT_OTHER): Payer: Self-pay | Admitting: Family Medicine

## 2023-09-06 DIAGNOSIS — F902 Attention-deficit hyperactivity disorder, combined type: Secondary | ICD-10-CM

## 2023-09-06 MED ORDER — AMPHETAMINE-DEXTROAMPHET ER 20 MG PO CP24
20.0000 mg | ORAL_CAPSULE | ORAL | 0 refills | Status: DC
Start: 2023-09-06 — End: 2024-01-28

## 2023-09-06 NOTE — Telephone Encounter (Signed)
 LVM for pt to call the office / mychart msg sent as well

## 2023-09-15 ENCOUNTER — Ambulatory Visit (HOSPITAL_BASED_OUTPATIENT_CLINIC_OR_DEPARTMENT_OTHER)
Admission: RE | Admit: 2023-09-15 | Discharge: 2023-09-15 | Disposition: A | Discharge status: Home / Self Care | Source: Ambulatory Visit | Attending: Family Medicine | Admitting: Family Medicine

## 2023-09-15 DIAGNOSIS — E010 Iodine-deficiency related diffuse (endemic) goiter: Secondary | ICD-10-CM | POA: Insufficient documentation

## 2023-09-15 DIAGNOSIS — R490 Dysphonia: Secondary | ICD-10-CM | POA: Diagnosis present

## 2023-09-17 ENCOUNTER — Other Ambulatory Visit (HOSPITAL_COMMUNITY)
Admission: RE | Admit: 2023-09-17 | Discharge: 2023-09-17 | Disposition: A | Discharge status: Home / Self Care | Source: Ambulatory Visit | Attending: Obstetrics & Gynecology | Admitting: Obstetrics & Gynecology

## 2023-09-17 ENCOUNTER — Encounter (HOSPITAL_BASED_OUTPATIENT_CLINIC_OR_DEPARTMENT_OTHER): Payer: Self-pay | Admitting: Obstetrics & Gynecology

## 2023-09-17 ENCOUNTER — Ambulatory Visit (HOSPITAL_BASED_OUTPATIENT_CLINIC_OR_DEPARTMENT_OTHER): Admitting: Obstetrics & Gynecology

## 2023-09-17 VITALS — BP 126/92 | HR 82 | Ht 62.0 in | Wt 185.2 lb

## 2023-09-17 DIAGNOSIS — N907 Vulvar cyst: Secondary | ICD-10-CM | POA: Diagnosis present

## 2023-09-17 NOTE — Progress Notes (Signed)
 GYNECOLOGY  VISIT  CC:   drainage/aspiration of vulvar cyst  HPI: 28 y.o. [id number] Single Black or African American female here for attempted drainage or aspiration of vulvar cyst that appears to be an oil cyst on the vulva.  Pt has several but the largest is around 3cm on the right labia majora.  MRI done on 05/25/23 shows these are likely vulvar cysts with oil in them however tissue sampling suggested.  She is here for this today.  Pt aware I have not aspirated something this size that appears to be filled with oil.  Consent obtained.  Possible need for additional procedure reviewed.     Past Medical History:  Diagnosis Date   ADHD    History of gestational diabetes 06/13/2022   Positive GBS test 10/15/2022   Postpartum hypertension     MEDS:   Current Outpatient Medications on File Prior to Visit  Medication Sig Dispense Refill   amphetamine -dextroamphetamine  (ADDERALL XR) 20 MG 24 hr capsule Take 1 capsule (20 mg total) by mouth every morning. 30 capsule 0   Prenatal Vit-Fe Fumarate-FA (PRENATAL MULTIVITAMIN) TABS tablet Take 1 tablet by mouth daily at 12 noon.     ACCU-CHEK GUIDE TEST test strip  (Patient not taking: Reported on 09/17/2023)     Accu-Chek Softclix Lancets lancets SMARTSIG:Lancet Topical (Patient not taking: Reported on 09/17/2023)     labetalol  (NORMODYNE ) 100 MG tablet Take 1 tablet (100 mg total) by mouth 2 (two) times daily. (Patient not taking: Reported on 09/17/2023) 60 tablet 1   No current facility-administered medications on file prior to visit.    ALLERGIES: Bactrim [sulfamethoxazole-trimethoprim] and Doxycycline  SH:  single, non smoker  Review of Systems  Constitutional: Negative.   Genitourinary: Negative.     PHYSICAL EXAMINATION:    BP (!) 126/92 (BP Location: Right Arm, Patient Position: Sitting)   Pulse 82   Ht 5\' 2"  (1.575 m)   Wt 185 lb 3.2 oz (84 kg)   LMP 09/07/2023 (Approximate)   BMI 33.87 kg/m     General appearance: alert,  cooperative and appears stated age  Lymph:  no inguinal LAD noted Pelvic: External genitalia:  larger 3cm oval cyst, mobile, and two small roundish cysts left labia and mons              Urethra:  normal appearing urethra with no masses, tenderness or lesions              Bartholins and Skenes: normal                  Procedure:  Area on right cleansed with betadine.  1.5cc 1% lidocaine  instilled above lesion.  Using #21 gauge needle, cyst wall aspirated but only small amount of drainage obtained.  With #18 gauge needle, cyst aspirated until it was fully collapsed.  Minimal spotting noted.  No dressing applied due to being in hair baring area.    Chaperone, Zenia Hight, RN, was present for exam.  Assessment/Plan: 1. Vulvar cyst (Primary) - successful aspiration of larger cyst performed today.  Cyst fluid will be sent to pathology. - Cytology - non gyn

## 2023-09-18 ENCOUNTER — Other Ambulatory Visit (HOSPITAL_BASED_OUTPATIENT_CLINIC_OR_DEPARTMENT_OTHER): Payer: Self-pay | Admitting: Family Medicine

## 2023-09-18 ENCOUNTER — Ambulatory Visit (HOSPITAL_BASED_OUTPATIENT_CLINIC_OR_DEPARTMENT_OTHER): Payer: Self-pay | Admitting: Family Medicine

## 2023-09-18 NOTE — Progress Notes (Signed)
 Hi Hayley Lawson,  Your thyroid  did show mild changes to the thyroid  tissue indicating the possibility of thyroid  disease. There were no nodules present. I would like to obtain further lab work to check for the present of thyroid  antibodies to rule out possible Hypothyroidism (Autoimmune Thyroid  disorder). If you are agreeable, please let me know and I will place the order for lab work.

## 2023-09-19 LAB — CYTOLOGY - NON PAP

## 2023-09-20 ENCOUNTER — Ambulatory Visit (HOSPITAL_BASED_OUTPATIENT_CLINIC_OR_DEPARTMENT_OTHER): Payer: Self-pay | Admitting: Obstetrics & Gynecology

## 2023-09-20 ENCOUNTER — Other Ambulatory Visit (HOSPITAL_BASED_OUTPATIENT_CLINIC_OR_DEPARTMENT_OTHER): Payer: Self-pay | Admitting: Family Medicine

## 2023-09-20 DIAGNOSIS — R490 Dysphonia: Secondary | ICD-10-CM

## 2023-10-03 NOTE — Telephone Encounter (Signed)
 Sent to front desk for patient to be added to schedule. tbw

## 2023-10-04 ENCOUNTER — Encounter (HOSPITAL_BASED_OUTPATIENT_CLINIC_OR_DEPARTMENT_OTHER): Payer: Self-pay | Admitting: Obstetrics & Gynecology

## 2023-10-04 ENCOUNTER — Ambulatory Visit (HOSPITAL_BASED_OUTPATIENT_CLINIC_OR_DEPARTMENT_OTHER): Admitting: Obstetrics & Gynecology

## 2023-10-04 VITALS — BP 125/72 | HR 81 | Ht 62.0 in | Wt 184.0 lb

## 2023-10-04 DIAGNOSIS — N907 Vulvar cyst: Secondary | ICD-10-CM

## 2023-10-04 IMAGING — CT CT HEAD W/O CM
4 series · 16 of 47 positions shown, 18 images · non-contrast
Comparison: CT head dated April 19, 2019.

CLINICAL DATA: Migraine headache for the past month, now with
double vision, dizziness, and emesis.



[Series 2: head wo · axial · 0.41mm/px · z∈[-215,-105]mm · 7 of 30 slices shown, 9 images]
[im 4/30  brain]
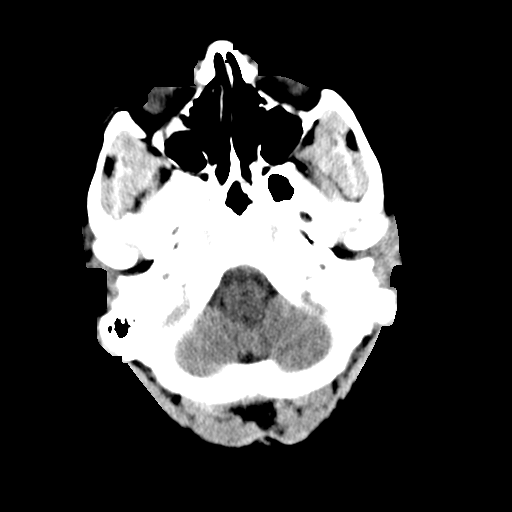
[im 4/30  bone]
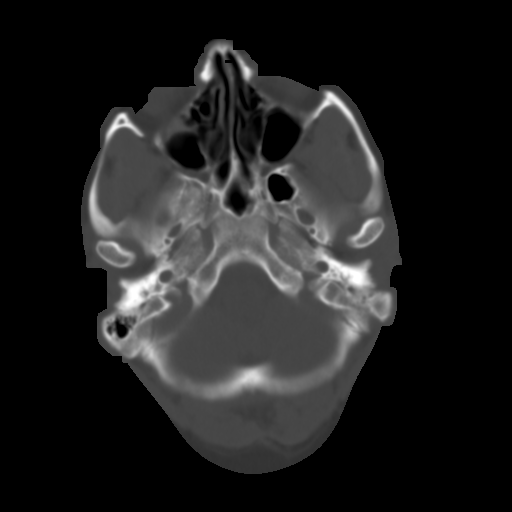
[im 8/30  brain]
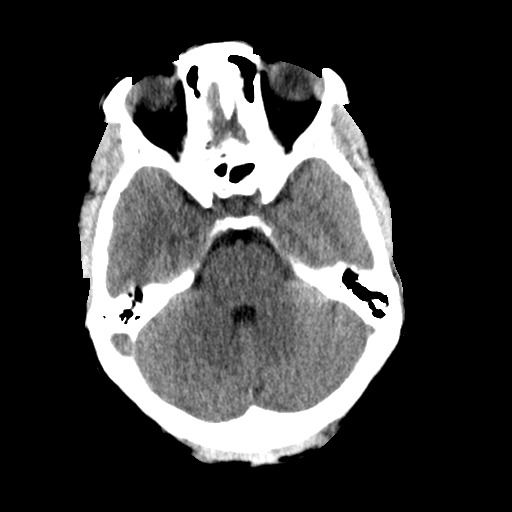
[im 11/30  brain]
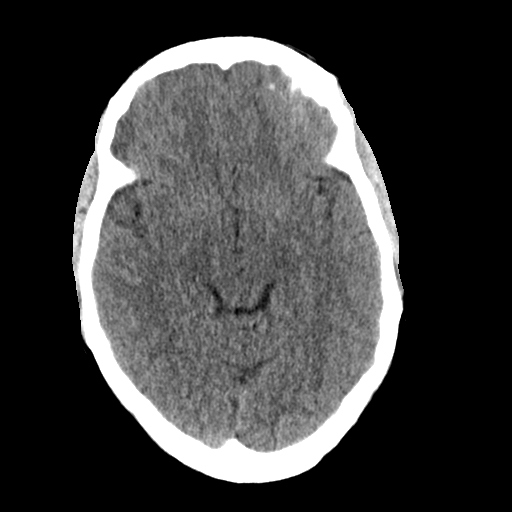
[im 15/30  brain]
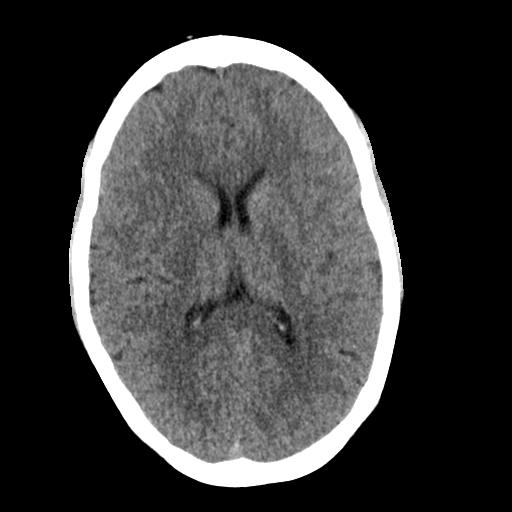
[im 19/30  brain]
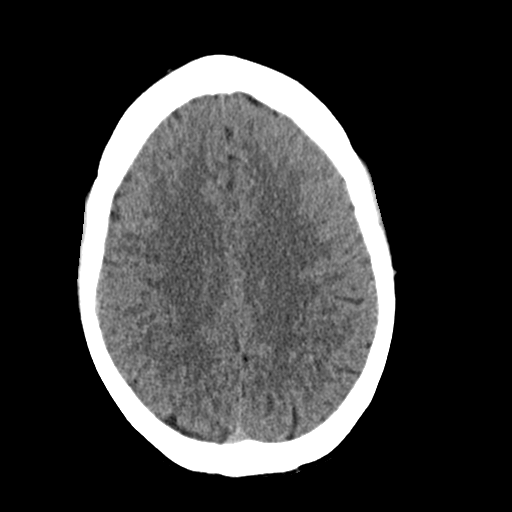
[im 19/30  bone]
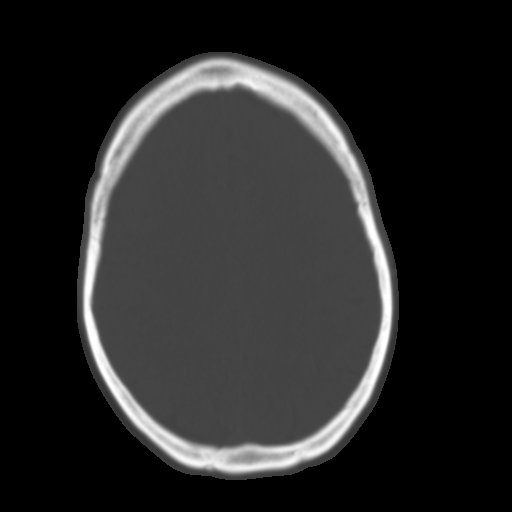
[im 22/30  brain]
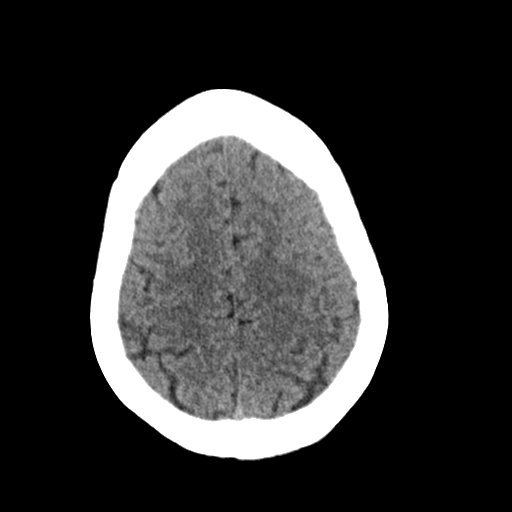
[im 26/30  brain]
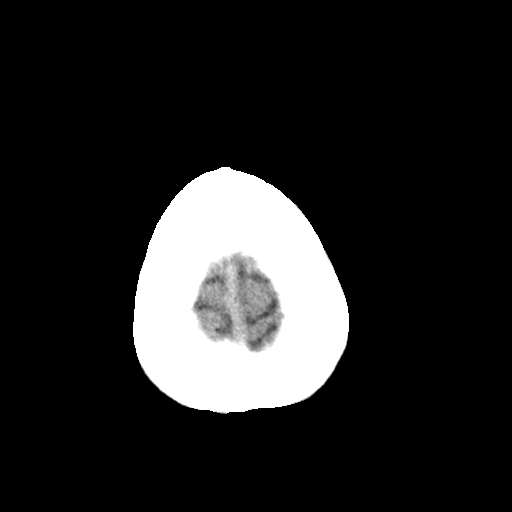

[Series 3: head bone · axial · 0.41mm/px · z∈[-216,-188]mm · 3 of 74 slices shown]
[im 8/74  bone]
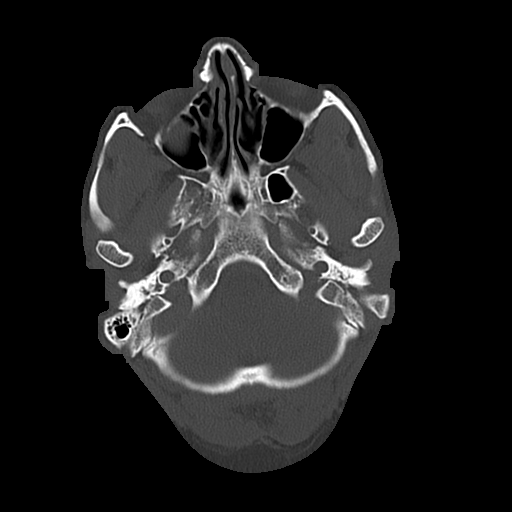
[im 15/74  bone]
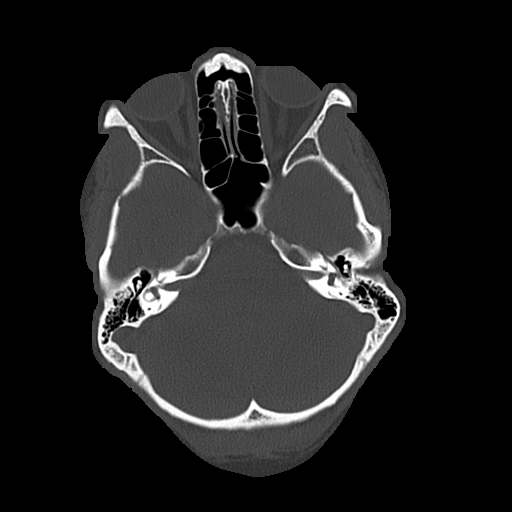
[im 22/74  bone]
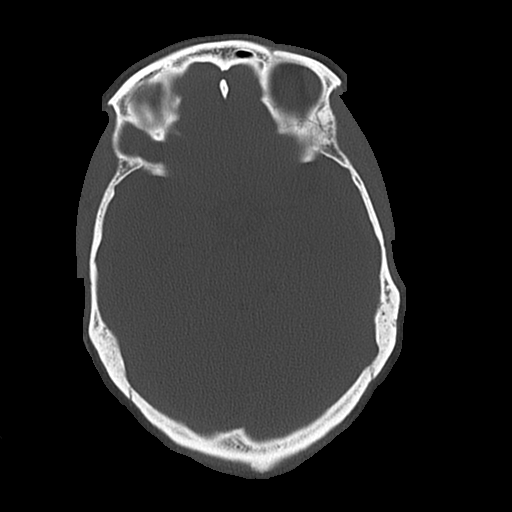

[Series 4: coronal soft · coronal · 0.31mm/px · 3 of 68 slices shown]
[im 23/68  brain]
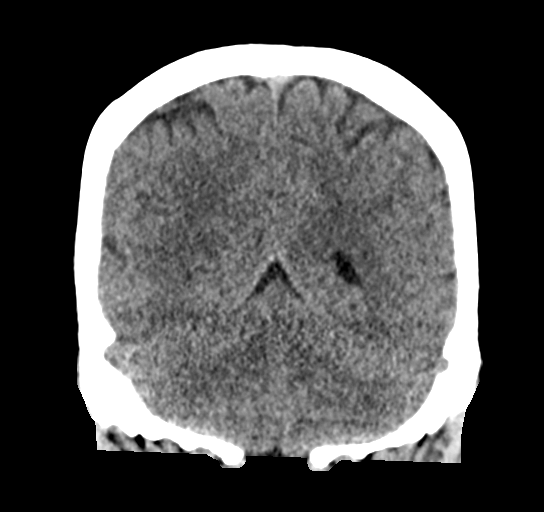
[im 30/68  brain]
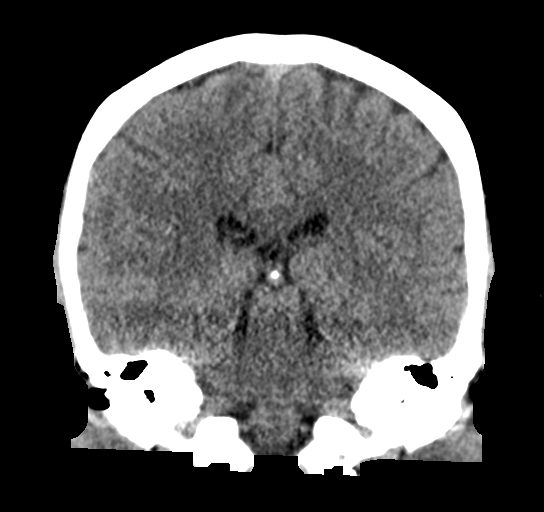
[im 38/68  brain]
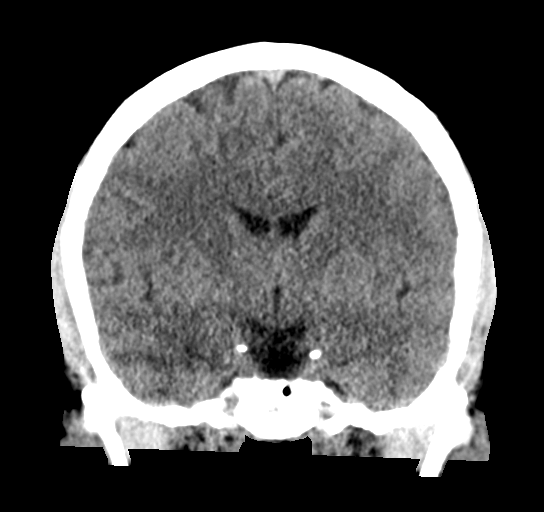

[Series 5: sagittal soft · sagittal · 0.31mm/px · 3 of 55 slices shown]
[im 19/55  brain]
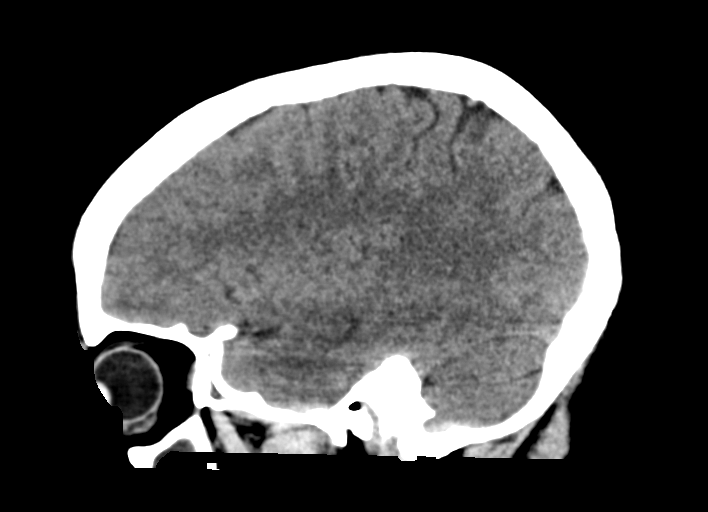
[im 28/55  brain]
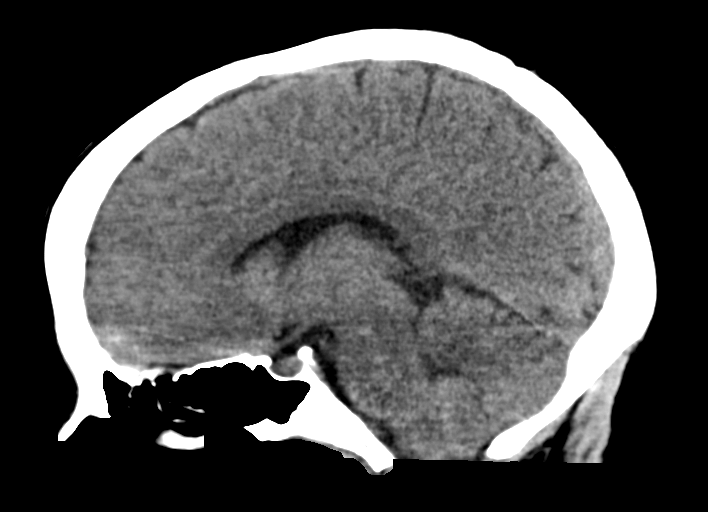
[im 37/55  brain]
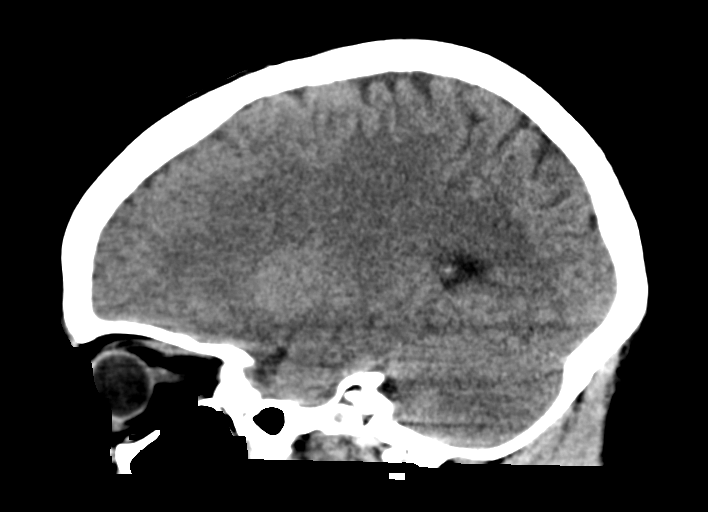

[16 of 47 positions shown; findings below may reference images not displayed]

FINDINGS: Brain: No evidence of acute infarction, hemorrhage, hydrocephalus,
extra-axial collection or mass lesion/mass effect.

Vascular: No hyperdense vessel or unexpected calcification.

Skull: Normal. Negative for fracture or focal lesion.

Sinuses/Orbits: No acute finding. Chronic large retention cyst in
the right maxillary sinus.

Other: None.
IMPRESSION: 1. No acute intracranial abnormality.

## 2023-10-07 NOTE — Progress Notes (Signed)
 GYNECOLOGY  VISIT  CC:   vulvar recheck  HPI: 28 y.o. [id number] Single Black or African American female here for recheck after having vulvar oil cyst drained.  Entire drainage could not be completed on 5/20.  Since then, she has noticed some tenderness at times as well as some reaccumulation of this.  No redness.  No drainage.  No fevers.   Past Medical History:  Diagnosis Date   ADHD    History of gestational diabetes 06/13/2022   Positive GBS test 10/15/2022   Postpartum hypertension     MEDS:   Current Outpatient Medications on File Prior to Visit  Medication Sig Dispense Refill   amphetamine -dextroamphetamine  (ADDERALL XR) 20 MG 24 hr capsule Take 1 capsule (20 mg total) by mouth every morning. 30 capsule 0   ACCU-CHEK GUIDE TEST test strip  (Patient not taking: Reported on 10/04/2023)     Accu-Chek Softclix Lancets lancets SMARTSIG:Lancet Topical (Patient not taking: Reported on 10/04/2023)     labetalol  (NORMODYNE ) 100 MG tablet Take 1 tablet (100 mg total) by mouth 2 (two) times daily. (Patient not taking: Reported on 10/04/2023) 60 tablet 1   Prenatal Vit-Fe Fumarate-FA (PRENATAL MULTIVITAMIN) TABS tablet Take 1 tablet by mouth daily at 12 noon. (Patient not taking: Reported on 10/04/2023)     No current facility-administered medications on file prior to visit.    ALLERGIES: Bactrim [sulfamethoxazole-trimethoprim] and Doxycycline  SH:  non smoker  Review of Systems  Constitutional: Negative.   Genitourinary: Negative.     PHYSICAL EXAMINATION:    BP 125/72 (BP Location: Left Arm, Patient Position: Sitting)   Pulse 81   Ht 5\' 2"  (1.575 m)   Wt 184 lb (83.5 kg)   LMP 09/07/2023 (Approximate)   BMI 33.65 kg/m     General appearance: alert, cooperative and appears stated age Lymph:  no inguinal LAD noted  Pelvic: External genitalia:  three cystic feeling vulvar lesions, larger on the right with some tenderness to deeper palpation, no erythema, no drainage, two smaller  lesion on the left              Urethra:  normal appearing urethra with no masses, tenderness or lesions              Bartholins and Skenes: normal                 Chaperone was present for exam.  Assessment/Plan: 1. Vulvar cyst (Primary) - as there is no evidence of infection, would recommend watching these lesion.  MRI has been completed and pathology did not show any abnormality with the cyst fluid.  If needs to be removed, I feel this would be best done in the OR due to needing sutures to close the lesion.  Pt comfortable with plan.

## 2023-11-06 ENCOUNTER — Other Ambulatory Visit (HOSPITAL_BASED_OUTPATIENT_CLINIC_OR_DEPARTMENT_OTHER): Payer: Self-pay | Admitting: Obstetrics & Gynecology

## 2023-11-06 ENCOUNTER — Encounter (HOSPITAL_BASED_OUTPATIENT_CLINIC_OR_DEPARTMENT_OTHER): Payer: Self-pay | Admitting: Obstetrics & Gynecology

## 2023-11-11 ENCOUNTER — Telehealth (INDEPENDENT_AMBULATORY_CARE_PROVIDER_SITE_OTHER): Admitting: Obstetrics & Gynecology

## 2023-11-11 DIAGNOSIS — F32A Depression, unspecified: Secondary | ICD-10-CM | POA: Diagnosis not present

## 2023-11-11 MED ORDER — DESVENLAFAXINE SUCCINATE ER 50 MG PO TB24
50.0000 mg | ORAL_TABLET | Freq: Every day | ORAL | 1 refills | Status: DC
Start: 2023-11-11 — End: 2024-02-03

## 2023-11-11 NOTE — Progress Notes (Unsigned)
 Virtual Visit  I connected with Hayley Lawson on 11/11/23 at 10:55 AM EDT.  She was having technical difficulties due to low battery so I ultimately ended up communicating via telephone and verified that I am speaking with the correct person using two identifiers.  Location: Patient: home Provider: office with door closed   I discussed the limitations, risks, security and privacy concerns of performing an evaluation and management service by telephone and the availability of in person appointments. I also discussed with the patient that there may be a patient responsible charge related to this service. The patient expressed understanding and agreed to proceed.   History of Present Illness: 28 yo G4P4 female who is just a little past one from from delivery of last child.  She reports that she decided to stop her Adderall XR recently and she thinks this was masking some moore disorder issues.  She is seeing a therapist who is on-line -- [name] with [facility]lin therapy Insurance claims handler.  Rosina suggest she reach out as well.  Pt feels mood is decreased and that she would benefit from some treatment.  PHQ scoring completed today.  Results of 15 moderately severe depression discussed.  She has used prozac and sertraline  in the past.  Didn't think those worked well for her.  Options discussed.  Will start prestiq for treatment  Flowsheet Row Video Visit from 11/11/2023 in [facility] for [facility]  PHQ-9 Total Score 15     Observations/Objective: N/a  Assessment and Plan: 1. Moderately severe depression (Primary) - will start treatment.  Pt is continuing on-line therapy - desvenlafaxine  (PRISTIQ ) 50 MG 24 hr tablet; Take 1 tablet (50 mg total) by mouth daily.  Dispense: 30 tablet; Refill: 1  - follow up 1 month planned.  Asked pt to give update in two weeks  Follow Up Instructions: I discussed the assessment and treatment plan with the  patient. The patient was provided an opportunity to ask questions and all were answered. The patient agreed with the plan and demonstrated an understanding of the instructions.   The patient was advised to call back or seek an in-person evaluation if the symptoms worsen or if the condition fails to improve as anticipated.  I provided 21 minutes of non-face-to-face time during this encounter.   Ronal GORMAN Pinal, MD

## 2023-11-14 ENCOUNTER — Encounter (HOSPITAL_BASED_OUTPATIENT_CLINIC_OR_DEPARTMENT_OTHER): Payer: Self-pay | Admitting: Obstetrics & Gynecology

## 2023-11-28 ENCOUNTER — Telehealth (INDEPENDENT_AMBULATORY_CARE_PROVIDER_SITE_OTHER): Payer: Self-pay | Admitting: Otolaryngology

## 2023-11-28 NOTE — Telephone Encounter (Signed)
 LVM to Confirm appt & location [phone] afm

## 2023-11-29 ENCOUNTER — Encounter (INDEPENDENT_AMBULATORY_CARE_PROVIDER_SITE_OTHER): Admitting: Otolaryngology

## 2023-11-29 ENCOUNTER — Encounter (INDEPENDENT_AMBULATORY_CARE_PROVIDER_SITE_OTHER): Payer: Self-pay

## 2023-11-29 NOTE — Progress Notes (Signed)
 error

## 2023-12-18 ENCOUNTER — Ambulatory Visit (HOSPITAL_BASED_OUTPATIENT_CLINIC_OR_DEPARTMENT_OTHER): Payer: Self-pay | Admitting: Obstetrics & Gynecology

## 2024-01-26 ENCOUNTER — Other Ambulatory Visit (HOSPITAL_BASED_OUTPATIENT_CLINIC_OR_DEPARTMENT_OTHER): Payer: Self-pay | Admitting: Obstetrics & Gynecology

## 2024-01-26 DIAGNOSIS — F32A Depression, unspecified: Secondary | ICD-10-CM

## 2024-01-27 NOTE — Telephone Encounter (Signed)
 Left message for patient. She needs to call and schedule an appointment for asap as she was supposed to follow up with  Dr. Cleotilde in August regarding medication.   Morna LOISE Quale, RN

## 2024-01-28 ENCOUNTER — Ambulatory Visit (INDEPENDENT_AMBULATORY_CARE_PROVIDER_SITE_OTHER): Admitting: Family Medicine

## 2024-01-28 ENCOUNTER — Encounter (HOSPITAL_BASED_OUTPATIENT_CLINIC_OR_DEPARTMENT_OTHER): Payer: Self-pay

## 2024-01-28 ENCOUNTER — Encounter (HOSPITAL_BASED_OUTPATIENT_CLINIC_OR_DEPARTMENT_OTHER): Payer: Self-pay | Admitting: Family Medicine

## 2024-01-28 VITALS — BP 137/90 | HR 72 | Ht 62.0 in | Wt 196.1 lb

## 2024-01-28 DIAGNOSIS — F902 Attention-deficit hyperactivity disorder, combined type: Secondary | ICD-10-CM

## 2024-01-28 DIAGNOSIS — E66812 Obesity, class 2: Secondary | ICD-10-CM | POA: Insufficient documentation

## 2024-01-28 MED ORDER — SEMAGLUTIDE-WEIGHT MANAGEMENT 0.25 MG/0.5ML ~~LOC~~ SOAJ: 0.2500 mg | SUBCUTANEOUS | 1 refills | Status: DC

## 2024-01-28 MED ORDER — AMPHETAMINE-DEXTROAMPHET ER 20 MG PO CP24
20.0000 mg | ORAL_CAPSULE | ORAL | 0 refills | Status: DC
Start: 2024-01-28 — End: 2024-03-09

## 2024-01-28 NOTE — Progress Notes (Signed)
    Procedures performed today:    None.  Independent interpretation of notes and tests performed by another provider:   None.  Brief History, Exam, Impression, and Recommendations:    BP (!) 137/90 (BP Location: Right Arm, Patient Position: Sitting, Cuff Size: Normal)   Pulse 72   Ht 5' 2 (1.575 m)   Wt 196 lb 1.6 oz (89 kg)   SpO2 98%   BMI 35.87 kg/m   Obesity, Class II, BMI 35-39.9 Assessment & Plan: Patient reports issues with trying to lose weight.  She reports that this has been ongoing for many years.  In the past, she has tried Hydroxycut without success.  Does not recall any other medications utilized.  She has tried various dietary adjustments, more recently has been trying intermittent fasting.  Despite adjustments in lifestyle modification including both dietary adjustments as well as regular exercise, she continues to have difficulty with losing weight and this is something that she has been focusing on for greater than 3 months.  Due to this, she has questions today about medication to assist with weight loss efforts.  She specifically has questions related to injectable medications. Long discussion today reviewing weight loss medications including injectable options including GLP-1 receptor agonist, oral agents.  We discussed potential risk, benefits, cost associated with these various medications as well as the possibility of insurance coverage or lack thereof.  We discussed typical dosing regimen for both injectable and oral medications, proper administration of each.  We discussed potential outcomes with each. After discussion of potential risks and adverse reactions with these medications and potential benefits of each, patient would like to proceed with injectable GLP-1 receptor agonist if possible.  Prescription sent to pharmacy on file, if medication is cost prohibitive for patient, she will let us  know and we can look to send an alternative option.  Will plan for  close follow-up to monitor response to whichever medication patient is able to initiate. Additional consideration is for referral to healthy weight and wellness clinic  Orders: -     Semaglutide-Weight Management; Inject 0.25 mg into the skin once a week.  Dispense: 2 mL; Refill: 1  Attention deficit hyperactivity disorder (ADHD), combined type Assessment & Plan: Patient previously was managing symptoms with Adderall and had appropriate control of symptoms.  She indicates that she did try to utilize natural supplement to control symptoms, however did not have significant success with this.  As a result, she would like to return to utilizing Adderall, requesting refill today Reviewed PDMP, no red flags.  Refill sent to pharmacy on file.  We will plan to follow-up on medication at least every few months for monitoring or sooner as needed  Orders: -     Amphetamine -Dextroamphet ER; Take 1 capsule (20 mg total) by mouth every morning.  Dispense: 30 capsule; Refill: 0  Return in about 4 weeks (around 02/25/2024) for med check, can be virtual.   ___________________________________________ Tyrek Lawhorn de [place], MD, ABFM, CAQSM Primary Care and Sports Medicine [facility]

## 2024-01-28 NOTE — Patient Instructions (Signed)
  Medication Instructions:  Your physician recommends that you continue on your current medications as directed. Please refer to the Current Medication list given to you today. --If you need a refill on any your medications before your next appointment, please call your pharmacy first. If no refills are authorized on file call the office.--   Follow-Up: Your next appointment:   Your physician recommends that you schedule a follow-up appointment in: 1 month follow up can be virtual  with Dr. de Peru  You will receive a text message or e-mail with a link to a survey about your care and experience with us  today! We would greatly appreciate your feedback!   Thanks for letting us  be apart of your health journey!!  Primary Care and Sports Medicine   Dr. Court Distance Peru   We encourage you to activate your patient portal called "MyChart".  Sign up information is provided on this After Visit Summary.  MyChart is used to connect with patients for Virtual Visits (Telemedicine).  Patients are able to view lab/test results, encounter notes, upcoming appointments, etc.  Non-urgent messages can be sent to your provider as well. To learn more about what you can do with MyChart, please visit --  [email or web address].

## 2024-01-28 NOTE — Assessment & Plan Note (Signed)
 Patient previously was managing symptoms with Adderall and had appropriate control of symptoms.  She indicates that she did try to utilize natural supplement to control symptoms, however did not have significant success with this.  As a result, she would like to return to utilizing Adderall, requesting refill today Reviewed PDMP, no red flags.  Refill sent to pharmacy on file.  We will plan to follow-up on medication at least every few months for monitoring or sooner as needed

## 2024-01-28 NOTE — Assessment & Plan Note (Signed)
 Patient reports issues with trying to lose weight.  She reports that this has been ongoing for many years.  In the past, she has tried Hydroxycut without success.  Does not recall any other medications utilized.  She has tried various dietary adjustments, more recently has been trying intermittent fasting.  Despite adjustments in lifestyle modification including both dietary adjustments as well as regular exercise, she continues to have difficulty with losing weight and this is something that she has been focusing on for greater than 3 months.  Due to this, she has questions today about medication to assist with weight loss efforts.  She specifically has questions related to injectable medications. Long discussion today reviewing weight loss medications including injectable options including GLP-1 receptor agonist, oral agents.  We discussed potential risk, benefits, cost associated with these various medications as well as the possibility of insurance coverage or lack thereof.  We discussed typical dosing regimen for both injectable and oral medications, proper administration of each.  We discussed potential outcomes with each. After discussion of potential risks and adverse reactions with these medications and potential benefits of each, patient would like to proceed with injectable GLP-1 receptor agonist if possible.  Prescription sent to pharmacy on file, if medication is cost prohibitive for patient, she will let us  know and we can look to send an alternative option.  Will plan for close follow-up to monitor response to whichever medication patient is able to initiate. Additional consideration is for referral to healthy weight and wellness clinic

## 2024-02-03 ENCOUNTER — Telehealth (HOSPITAL_BASED_OUTPATIENT_CLINIC_OR_DEPARTMENT_OTHER): Payer: Self-pay

## 2024-02-03 DIAGNOSIS — F32A Depression, unspecified: Secondary | ICD-10-CM

## 2024-02-03 MED ORDER — DESVENLAFAXINE SUCCINATE ER 50 MG PO TB24: 50.0000 mg | ORAL_TABLET | Freq: Every day | ORAL | 0 refills | Status: DC

## 2024-02-03 NOTE — Telephone Encounter (Signed)
 Attempted to reach patient at number listed [phone]. No answer and voicemail box is full.  Shaely Clotilda to Morna LOISE Quale, RN 02/03/24  2:56 PM I have reached out to [phone] and my therapist but since I've been off it I've had very low moments or can I speak with a nurse  Ilah Clotilda to Morna LOISE Quale, RN 02/03/24  2:55 PM Can you ask dr cleotilde to refill my prescription before my appointment I've been on it for 2 months now and it was helping I think without it I'm crashing and having suicidal thoughts .

## 2024-02-05 ENCOUNTER — Telehealth (INDEPENDENT_AMBULATORY_CARE_PROVIDER_SITE_OTHER): Admitting: Obstetrics & Gynecology

## 2024-02-05 DIAGNOSIS — F321 Major depressive disorder, single episode, moderate: Secondary | ICD-10-CM | POA: Diagnosis not present

## 2024-02-05 NOTE — Progress Notes (Signed)
 Virtual Visit via Video Note  I connected with Hayley Lawson on 02/05/24 at 10:35 AM EDT by a video enabled telemedicine application and verified that I am speaking with the correct person using two identifiers.  Location: Patient: in car traveling Provider: office   I discussed the limitations of evaluation and management by telemedicine and the availability of in person appointments. The patient expressed understanding and agreed to proceed.  History of Present Illness: 28 yo G90P4 female with hx depression now with worsening symptoms.  Has been treated in the past with multiple different SSRIs without a lot of success with symptom improvement but prestiq has helped.  Does have a therapist.  Was off the Prestiq and began recognizing she was having worsening thoughts and suicidal ideations (without a plan).  I did not realize she was out of this so it was refilled two days ago.  She did have a PHQ 9 done on 9/30 and it was 17.  With just starting back the medication two days ago, would likely not be different.  She does feel supported.  Does also have access to resources and knows resources for suicidal ideations as well.     Observations/Objective: WNWD WF, NAD  Assessment and Plan: 1. Moderate major depression (HCC) (Primary) - Continue prestiq 50mg  daily.  Continue therapy.  Advised to give update in 2- 3 weeks it not feeling improvement.  Advised to please let me know in the future if out of medication as I don't want her to be without this unless she feels she is at the place where she would consider stopping and, even then, we would decrease this slowly with weaning.     Follow Up Instructions: I discussed the assessment and treatment plan with the patient. The patient was provided an opportunity to ask questions and all were answered. The patient agreed with the plan and demonstrated an understanding of the instructions.   The patient was advised to call back or seek an in-person  evaluation if the symptoms worsen or if the condition fails to improve as anticipated.  I provided 21 minutes of non-face-to-face time during this encounter.   Morna LOISE Quale, RN

## 2024-02-23 ENCOUNTER — Encounter (HOSPITAL_BASED_OUTPATIENT_CLINIC_OR_DEPARTMENT_OTHER): Payer: Self-pay | Admitting: Family Medicine

## 2024-02-23 DIAGNOSIS — F902 Attention-deficit hyperactivity disorder, combined type: Secondary | ICD-10-CM

## 2024-02-24 ENCOUNTER — Encounter (HOSPITAL_BASED_OUTPATIENT_CLINIC_OR_DEPARTMENT_OTHER): Payer: Self-pay | Admitting: Obstetrics & Gynecology

## 2024-02-24 NOTE — Telephone Encounter (Signed)
 Mychart sent by pt. Made pt aware that as of now medicaid is not covering injectable medications for weight loss. Please advise if there are any other recommendations for pt.

## 2024-02-26 ENCOUNTER — Ambulatory Visit (HOSPITAL_BASED_OUTPATIENT_CLINIC_OR_DEPARTMENT_OTHER): Admitting: Family Medicine

## 2024-02-28 ENCOUNTER — Encounter (HOSPITAL_BASED_OUTPATIENT_CLINIC_OR_DEPARTMENT_OTHER): Payer: Self-pay | Admitting: Obstetrics & Gynecology

## 2024-03-02 NOTE — Telephone Encounter (Signed)
 Pt sent another mychart message checking on response from prior message sent. Routing again to PCP for review.

## 2024-03-03 MED ORDER — NALTREXONE-BUPROPION HCL ER 8-90 MG PO TB12: ORAL_TABLET | ORAL | 0 refills | Status: DC

## 2024-03-04 MED ORDER — NALTREXONE-BUPROPION HCL ER 8-90 MG PO TB12: ORAL_TABLET | ORAL | 0 refills | Status: DC

## 2024-03-04 NOTE — Telephone Encounter (Signed)
 Pt sent message back stating she does want to have Rx sent to pharmacy.

## 2024-03-07 NOTE — Telephone Encounter (Signed)
 New mychart message sent by pt requesting a refill of her adderall. Please advise.

## 2024-03-09 MED ORDER — AMPHETAMINE-DEXTROAMPHET ER 20 MG PO CP24: 20.0000 mg | ORAL_CAPSULE | ORAL | 0 refills | Status: DC

## 2024-03-09 NOTE — Addendum Note (Signed)
 Addended by: DE CUBA, Abbagail Scaff J on: 03/09/2024 11:46 AM   Modules accepted: Orders

## 2024-03-18 ENCOUNTER — Ambulatory Visit: Payer: Self-pay

## 2024-03-18 NOTE — Telephone Encounter (Signed)
 FYI Only or Action Required?: FYI only for provider: appointment scheduled on 03/19/24.  Patient was last seen in primary care on 01/28/2024 by de Cuba, Quintin PARAS, MD.  Called Nurse Triage reporting Mental Health Problem.  Symptoms began several weeks ago.  Interventions attempted: Other: Therapy.  Symptoms are: unchanged.  Triage Disposition: See Physician Within 24 Hours  Patient/caregiver understands and will follow disposition?: Yes     Copied from CRM #[id number]. Topic: Clinical - Red Word Triage >> Mar 18, 2024  8:44 AM Victoria B wrote: Patient having suicidal thoughts Reason for Disposition  Sometimes has thoughts of suicide  Answer Assessment - Initial Assessment Questions Pt denies current SI/HI or plan to harm self or others. Notes she is a single mother of 4 young children with the youngest being 1. She notes she has spoken to her OBGYN about this as she has experienced PPD but would like to discuss medication, MH referral and documentation for work as she has recently been missing work due to her depression/overwhelm. She reports she is not able to perform at her normal functional level at work and has been increasingly missing more work. She notes intermittent thoughts of suicide, difficulty getting out of bed or being motivated to do much, fatigue, tachycardia with panic attacks which she has discussed with her therapist and has worked on putting into practice coping mechanisms when these occur.   1. CONCERN: What happened that made you call today?     Pt would like to see provider for [facility] referral and work note for missed time due to depression 2. DEPRESSION SYMPTOM SCREENING: How are you feeling overall? (e.g., decreased energy, increased sleeping or difficulty sleeping, difficulty concentrating, feelings of sadness, guilt, hopelessness, or worthlessness)     Crying, irritable, hopeless, lack of energy/motivation, decreased energy 3. RISK OF HARM - SUICIDAL IDEATION:   Do you ever have thoughts of hurting or killing yourself?  (e.g., yes, no, no but preoccupation with thoughts about death)     None 4. RISK OF HARM - HOMICIDAL IDEATION:  Do you ever have thoughts of hurting or killing someone else?  (e.g., yes, no, no but preoccupation with thoughts about death)     None 5. FUNCTIONAL IMPAIRMENT: How have things been going for you overall? Have you had more difficulty than usual doing your normal daily activities?  (e.g., better, same, worse; self-care, school, work, interactions)     Difficulty performing/going to work 6. SUPPORT: Who is with you now? Who do you live with? Do you have family or friends who you can talk to?      Pt lives with 4 children, single mother 10. THERAPIST: Do you have a counselor or therapist? If Yes, ask: What is their name?     Pt sees therapist 8. STRESSORS: Has there been any new stress or recent changes in your life?     4 children 9. ALCOHOL USE OR SUBSTANCE USE (DRUG USE): Do you drink alcohol or use any illegal drugs?     None 10. OTHER: Do you have any other physical symptoms right now? (e.g., fever)       Fatigue, pt notes she does experiencing panic attacks. 11. PREGNANCY: Is there any chance you are pregnant? When was your last menstrual period?       Postpartum 12 mo  Protocols used: Depression-A-AH

## 2024-03-19 ENCOUNTER — Ambulatory Visit (INDEPENDENT_AMBULATORY_CARE_PROVIDER_SITE_OTHER): Admitting: Family Medicine

## 2024-03-19 ENCOUNTER — Encounter (HOSPITAL_BASED_OUTPATIENT_CLINIC_OR_DEPARTMENT_OTHER): Payer: Self-pay | Admitting: Family Medicine

## 2024-03-19 VITALS — BP 118/78 | HR 87 | Ht 62.0 in | Wt 192.0 lb

## 2024-03-19 DIAGNOSIS — F321 Major depressive disorder, single episode, moderate: Secondary | ICD-10-CM | POA: Diagnosis not present

## 2024-03-19 DIAGNOSIS — F32A Depression, unspecified: Secondary | ICD-10-CM | POA: Insufficient documentation

## 2024-03-19 MED ORDER — DESVENLAFAXINE SUCCINATE ER 50 MG PO TB24: 50.0000 mg | ORAL_TABLET | Freq: Every day | ORAL | 0 refills | Status: DC

## 2024-03-19 NOTE — Assessment & Plan Note (Signed)
 Notes that she has had ongoing issues with postpartum depression - started on desvenlafaxine  by her OB/GYN. Has been doing somewhat better with this, although still with some residual symptoms.  She notes that medication will wear off as the day progresses, feels that she will experience some irritability in the late afternoon/early evening.  She does have a therapist that she is currently working with.  Would be interested in meeting with psychiatrist. She has been having FMLA paperwork completed by OB/GYN due to leave of absence related to ongoing mental health issues.  She is wondering if we can take over this. We discussed options today, can continue with current dose of medication.  We did review possible increase in dose of desvenlafaxine , however she would prefer to hold off on this for now. We can assist patient with completing paperwork related to FMLA at this time.  Ultimately, when she does establish with psychiatry, would defer completion of paperwork to them as they would be better suited to provide expected prognosis and recommendations moving forward. Recommend continuing with therapy. We will plan to follow-up in about 2 months to monitor progress.  If she is able to get set up with psychiatry before then, then she can cancel appointment with us .

## 2024-03-19 NOTE — Progress Notes (Signed)
    Procedures performed today:    None.  Independent interpretation of notes and tests performed by another provider:   None.  Brief History, Exam, Impression, and Recommendations:    BP 118/78 (BP Location: Right Arm, Patient Position: Sitting, Cuff Size: Normal)   Pulse 87   Ht 5' 2 (1.575 m)   Wt 192 lb (87.1 kg)   SpO2 97%   BMI 35.12 kg/m   Current moderate episode of major depressive disorder, unspecified whether recurrent (HCC) Assessment & Plan: Notes that she has had ongoing issues with postpartum depression - started on desvenlafaxine  by her OB/GYN. Has been doing somewhat better with this, although still with some residual symptoms.  She notes that medication will wear off as the day progresses, feels that she will experience some irritability in the late afternoon/early evening.  She does have a therapist that she is currently working with.  Would be interested in meeting with psychiatrist. She has been having FMLA paperwork completed by OB/GYN due to leave of absence related to ongoing mental health issues.  She is wondering if we can take over this. We discussed options today, can continue with current dose of medication.  We did review possible increase in dose of desvenlafaxine , however she would prefer to hold off on this for now. We can assist patient with completing paperwork related to FMLA at this time.  Ultimately, when she does establish with psychiatry, would defer completion of paperwork to them as they would be better suited to provide expected prognosis and recommendations moving forward. Recommend continuing with therapy. We will plan to follow-up in about 2 months to monitor progress.  If she is able to get set up with psychiatry before then, then she can cancel appointment with us .  Orders: -     Ambulatory referral to Psychiatry -     Desvenlafaxine  Succinate ER; Take 1 tablet (50 mg total) by mouth daily.  Dispense: 90 tablet; Refill: 0  Return in  about 8 weeks (around 05/14/2024) for med check.   ___________________________________________ Haytham Maher de [place], MD, ABFM, [facility] Primary Care and Sports Medicine [facility]

## 2024-03-23 NOTE — Telephone Encounter (Signed)
 Please see new mychart message sent by pt. Paperwork has also been printed off and placed on Dr. Everitt Cuba's work area for review and completion.

## 2024-04-02 ENCOUNTER — Telehealth (HOSPITAL_BASED_OUTPATIENT_CLINIC_OR_DEPARTMENT_OTHER): Payer: Self-pay | Admitting: Pharmacy Technician

## 2024-04-02 ENCOUNTER — Other Ambulatory Visit (HOSPITAL_COMMUNITY): Payer: Self-pay

## 2024-04-02 NOTE — Telephone Encounter (Unsigned)
 Copied from CRM #[id number]. Topic: Appointments - Transfer of Care >> Apr 02, 2024  1:43 PM Joesph B wrote: Pt is requesting to transfer FROM: Dr.De cuba Pt is requesting to transfer TO: Thersia Stark Reason for requested transfer: N/A It is the responsibility of the team the patient would like to transfer to (Dr. Stark) to reach out to the patient if for any reason this transfer is not acceptable.

## 2024-04-02 NOTE — Telephone Encounter (Signed)
 Pharmacy Patient Advocate Encounter   Received notification from Onbase that prior authorization for Wegovy  0.25mg /0.40ml auto-injectors is required/requested.   Insurance verification completed.   The patient is insured through [facility] MEDICAID.   Effective October 1st, Medicaid discontinued coverage of GLP1 medications for weight loss (such as Wegovy  and Zepbound), unless the patient has a documented history of a heart attack or stroke and is age 29 or older with a BMI greater than 27 for coverage of Wegovy . Zepbound will continue to be covered only for patients with moderate to severe sleep apnea (AHI 15-30) and a BMI greater than 30.

## 2024-04-06 MED ORDER — DESVENLAFAXINE SUCCINATE ER 50 MG PO TB24: 50.0000 mg | ORAL_TABLET | Freq: Every day | ORAL | 0 refills | Status: AC

## 2024-04-06 NOTE — Addendum Note (Signed)
 Addended by: DE CUBA, QUINTIN J on: 04/06/2024 03:07 PM   Modules accepted: Orders

## 2024-04-09 ENCOUNTER — Encounter (HOSPITAL_BASED_OUTPATIENT_CLINIC_OR_DEPARTMENT_OTHER): Payer: Self-pay | Admitting: Family Medicine

## 2024-04-09 DIAGNOSIS — F902 Attention-deficit hyperactivity disorder, combined type: Secondary | ICD-10-CM

## 2024-04-09 MED ORDER — AMPHETAMINE-DEXTROAMPHET ER 20 MG PO CP24: 20.0000 mg | ORAL_CAPSULE | ORAL | 0 refills | Status: AC

## 2024-04-09 NOTE — Telephone Encounter (Signed)
 Please see mychart message sent by pt and advise.

## 2024-04-13 ENCOUNTER — Telehealth (HOSPITAL_BASED_OUTPATIENT_CLINIC_OR_DEPARTMENT_OTHER): Payer: Self-pay | Admitting: Pharmacy Technician

## 2024-04-13 ENCOUNTER — Other Ambulatory Visit (HOSPITAL_COMMUNITY): Payer: Self-pay

## 2024-04-13 NOTE — Telephone Encounter (Signed)
 Per patient, generic is out of stock and only brand is available.

## 2024-04-13 NOTE — Telephone Encounter (Signed)
 Pharmacy Patient Advocate Encounter   Received notification from Onbase that prior authorization for Adderall XR 20mg  ER capsules is required/requested.   Insurance verification completed.   The patient is insured through [facility] MEDICAID.   Per test claim:  see below is preferred by the insurance.  If suggested medication is appropriate, Please send in a new RX and discontinue this one. If not, please advise as to why it's not appropriate so that we may request a Prior Authorization. Please note, some preferred medications may still require a PA.  If the suggested medications have not been trialed and there are no contraindications to their use, the PA will not be submitted, as it will not be approved.  Brand is non-preferred and the generic is on backorder. Spoke with [facility] this afternoon and they advised they could try and order it again but had no idea if/when it would come in. Would you like to change therapy to another preferred medication? She has to try/fail 2 preferred drugs before I can get the brand name Adderall XR approved. Please advise.

## 2024-04-15 ENCOUNTER — Other Ambulatory Visit (HOSPITAL_COMMUNITY): Payer: Self-pay

## 2024-04-15 NOTE — Telephone Encounter (Signed)
 PA request has been Approved. New Encounter has been or will be created for follow up. For additional info see Pharmacy Prior Auth telephone encounter from 04/13/2024.

## 2024-04-15 NOTE — Telephone Encounter (Signed)
 Pharmacy Patient Advocate Encounter  Received notification from [employer] that Prior Authorization for Adderall XR 20mg  capsules has been APPROVED from 04/15/2024 to 04/15/2025.   PA #/Case ID/Reference #: [id number]

## 2024-04-15 NOTE — Telephone Encounter (Signed)
 Routing pt's new mychart messages to our prior auth team for review

## 2024-04-16 ENCOUNTER — Other Ambulatory Visit (HOSPITAL_COMMUNITY): Payer: Self-pay

## 2024-04-16 ENCOUNTER — Ambulatory Visit (INDEPENDENT_AMBULATORY_CARE_PROVIDER_SITE_OTHER)

## 2024-04-16 VITALS — BP 128/89 | HR 103 | Ht 62.0 in | Wt 195.3 lb

## 2024-04-16 DIAGNOSIS — G4730 Sleep apnea, unspecified: Secondary | ICD-10-CM | POA: Diagnosis not present

## 2024-04-16 NOTE — Progress Notes (Signed)
 Subjective:   Hayley Lawson 1995-08-04 04/16/2024  Chief Complaint  Patient presents with   Medical Management of Chronic Issues    Patient is here to been seen for headaches. States she has been having trouble breathing when sleeping. Symptoms have been going for about 2 years.     HPI: Hayley Lawson presents today for re-assessment and management of chronic medical conditions.  States for approximately 2 years she wakes up feeling like she can't breath or gets a choking sensation. States she frequently gets headaches in the morning which go away after she starts her day. States when she wakes up she has a dry throat and will be hoarse. States her fiance has told her she snores and stops breathing during the night. States she has not slept a full 8 hours in a long time and feels she goes through her days exhausted. States she has tried to do a sleep study at home but has not done an official study.   The following portions of the patient's history were reviewed and updated as appropriate: past medical history, past surgical history, family history, social history, allergies, medications, and problem list.   Patient Active Problem List   Diagnosis Date Noted   Depression 03/19/2024   Obesity, Class II, BMI 35-39.9 01/28/2024   Hoarseness of voice 09/04/2023   Thyromegaly 09/04/2023   Primary hypertension 02/06/2023   Viral URI 09/10/2022   Migraines 09/05/2021   ADHD 09/05/2021   Past Medical History:  Diagnosis Date   ADHD    Depression 04/01/2013   History of gestational diabetes 06/13/2022   Positive GBS test 10/15/2022   Postpartum hypertension    Past Surgical History:  Procedure Laterality Date   CHOLECYSTECTOMY  2019   COSMETIC SURGERY  01/03/2021   UMBILICAL HERNIA REPAIR  2001   Family History  Problem Relation Age of Onset   Migraines Mother    Depression Mother    Stroke Mother    Stroke Father    Depression Father    Anxiety disorder Father     Diabetes Father    Heart disease Maternal Grandmother    Alcohol abuse Neg Hx    Arthritis Neg Hx    Asthma Neg Hx    Birth defects Neg Hx    COPD Neg Hx    Cancer Neg Hx    Drug abuse Neg Hx    Early death Neg Hx    Hearing loss Neg Hx    Hyperlipidemia Neg Hx    Hypertension Neg Hx    Kidney disease Neg Hx    Learning disabilities Neg Hx    Mental illness Neg Hx    Mental retardation Neg Hx    Miscarriages / Stillbirths Neg Hx    Vision loss Neg Hx    Varicose Veins Neg Hx    Outpatient Medications Prior to Visit  Medication Sig Dispense Refill   amphetamine -dextroamphetamine  (ADDERALL XR) 20 MG 24 hr capsule Take 1 capsule (20 mg total) by mouth every morning. 30 capsule 0   desvenlafaxine  (PRISTIQ ) 50 MG 24 hr tablet Take 1 tablet (50 mg total) by mouth daily. 90 tablet 0   No facility-administered medications prior to visit.   Allergies[1]   ROS: A complete ROS was performed with pertinent positives/negatives noted in the HPI. The remainder of the ROS are negative.    Objective:   Today's Vitals   04/16/24 1050 04/16/24 1118  BP: (!) 136/98 128/89  Pulse: (!) 103  SpO2: 98%   Weight: 195 lb 4.8 oz (88.6 kg)   Height: 5' 2 (1.575 m)     GENERAL: Well-appearing, in NAD. Well nourished.  RESPIRATORY: Chest wall symmetrical. Respirations even and non-labored. Breath sounds clear to auscultation bilaterally.  CARDIAC: S1, S2 present, regular rate and rhythm without murmur or gallops. Peripheral pulses 2+ bilaterally.  MSK: Muscle tone and strength appropriate for age. Joints w/o tenderness, redness, or swelling.  PSYCH/MENTAL STATUS: Alert, oriented x 3. Cooperative, appropriate mood and affect.    Assessment & Plan:  1. Sleep apnea in adult (Primary) Discussed signs of sleep apnea and following up with neurology for a specific OSA sleep study. Discussed any red flag symptoms and when to be seen by PCP or ER.  - Ambulatory referral to Neurology    Return if  symptoms worsen or fail to improve.    Patient to reach out to office if new, worrisome, or unresolved symptoms arise or if no improvement in patient's condition. Patient verbalized understanding and is agreeable to treatment plan. All questions answered to patient's satisfaction.    Lauraine Almarie Angus DNP, FNP-C     [1]  Allergies Allergen Reactions   Bactrim [Sulfamethoxazole-Trimethoprim] Rash   Doxycycline Rash

## 2024-04-20 ENCOUNTER — Telehealth (HOSPITAL_BASED_OUTPATIENT_CLINIC_OR_DEPARTMENT_OTHER): Payer: Self-pay

## 2024-04-20 NOTE — Telephone Encounter (Signed)
 Copied from CRM [id number]. Topic: General - Other >> Apr 20, 2024  3:01 PM Shanda MATSU wrote: Reason for CRM: Patient calling back in regards to Disability paperwork that she says she has been trying to have filled out for a month now, patient stated that she is close to losing her job because her provider still has not filled these out, is req a call back.

## 2024-05-07 ENCOUNTER — Ambulatory Visit: Admitting: Neurology

## 2024-05-07 ENCOUNTER — Encounter: Payer: Self-pay | Admitting: Neurology

## 2024-05-07 VITALS — BP 136/95 | HR 95 | Ht 62.0 in | Wt 196.0 lb

## 2024-05-07 DIAGNOSIS — Z820 Family history of epilepsy and other diseases of the nervous system: Secondary | ICD-10-CM

## 2024-05-07 DIAGNOSIS — R0683 Snoring: Secondary | ICD-10-CM

## 2024-05-07 DIAGNOSIS — Z9189 Other specified personal risk factors, not elsewhere classified: Secondary | ICD-10-CM

## 2024-05-07 DIAGNOSIS — R0681 Apnea, not elsewhere classified: Secondary | ICD-10-CM | POA: Diagnosis not present

## 2024-05-07 DIAGNOSIS — E669 Obesity, unspecified: Secondary | ICD-10-CM

## 2024-05-07 DIAGNOSIS — R351 Nocturia: Secondary | ICD-10-CM

## 2024-05-07 DIAGNOSIS — R519 Headache, unspecified: Secondary | ICD-10-CM | POA: Diagnosis not present

## 2024-05-07 DIAGNOSIS — R400 Somnolence: Secondary | ICD-10-CM | POA: Diagnosis not present

## 2024-05-07 NOTE — Patient Instructions (Signed)
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
## 2024-05-12 ENCOUNTER — Encounter (HOSPITAL_BASED_OUTPATIENT_CLINIC_OR_DEPARTMENT_OTHER): Payer: Self-pay | Admitting: Family Medicine

## 2024-05-12 DIAGNOSIS — F902 Attention-deficit hyperactivity disorder, combined type: Secondary | ICD-10-CM

## 2024-05-13 MED ORDER — ADDERALL XR 20 MG PO CP24: 20.0000 mg | ORAL_CAPSULE | Freq: Every day | ORAL | 0 refills | Status: AC

## 2024-05-16 ENCOUNTER — Telehealth: Admitting: Nurse Practitioner

## 2024-05-16 DIAGNOSIS — B379 Candidiasis, unspecified: Secondary | ICD-10-CM

## 2024-05-16 DIAGNOSIS — B359 Dermatophytosis, unspecified: Secondary | ICD-10-CM

## 2024-05-16 MED ORDER — CLOTRIMAZOLE 1 % EX CREA: 1.0000 | TOPICAL_CREAM | Freq: Two times a day (BID) | CUTANEOUS | 0 refills | Status: AC

## 2024-05-16 MED ORDER — FLUCONAZOLE 150 MG PO TABS: 150.0000 mg | ORAL_TABLET | Freq: Once | ORAL | 0 refills | Status: AC

## 2024-05-16 MED ORDER — HYDROXYZINE HCL 10 MG PO TABS: 10.0000 mg | ORAL_TABLET | Freq: Three times a day (TID) | ORAL | 0 refills | Status: AC | PRN

## 2024-05-16 NOTE — Progress Notes (Signed)
 You have a fungal infection in one picture. The other picture of your neck is hard to see. I would recommend hydrocortisone over the counter for the neck area and I will send a fungal cream for the other rash. I have also sent hydroxyzine  pills for the itching.   E Visit for Rash  We are sorry that you are not feeling well. Here is how we plan to help!   HOME CARE:  Take cool showers and avoid direct sunlight. Apply cool compress or wet dressings. Take a bath in an oatmeal bath.  Sprinkle content of one Aveeno packet under running faucet with comfortably warm water.  Bathe for 15-20 minutes, 1-2 times daily.  Pat dry with a towel. Do not rub the rash. Use hydrocortisone cream. Take an antihistamine like Benadryl  for widespread rashes that itch.  The adult dose of Benadryl  is 25-50 mg by mouth 4 times daily. Caution:  This type of medication may cause sleepiness.  Do not drink alcohol, drive, or operate dangerous machinery while taking antihistamines.  Do not take these medications if you have prostate enlargement.  Read package instructions thoroughly on all medications that you take.  GET HELP RIGHT AWAY IF:  Symptoms don't go away after treatment. Severe itching that persists. If you rash spreads or swells. If you rash begins to smell. If it blisters and opens or develops a yellow-brown crust. You develop a fever. You have a sore throat. You become short of breath.  MAKE SURE YOU:  Understand these instructions. Will watch your condition. Will get help right away if you are not doing well or get worse.  Thank you for choosing an e-visit. Your e-visit answers were reviewed by a board certified advanced clinical practitioner to complete your personal care plan. Depending upon the condition, your plan could have included both over the counter or prescription medications. Please review your pharmacy choice. Be sure that the pharmacy you have chosen is open so that you can pick up  your prescription now.  If there is a problem you may message your provider in MyChart to have the prescription routed to another pharmacy. Your safety is important to us . If you have drug allergies check your prescription carefully.  For the next 24 hours, you can use MyChart to ask questions about todays visit, request a non-urgent call back, or ask for a work or school excuse from your e-visit provider. You will get an email in the next two days asking about your experience. I hope that your e-visit has been valuable and will speed your recovery.  I have spent 5 minutes in review of e-visit questionnaire, review and updating patient chart, medical decision making and response to patient.   Japji Kok W Janyia Guion, NP

## 2024-05-16 NOTE — Progress Notes (Signed)
 " Virtual Visit Consent   Hayley Lawson, you are scheduled for a virtual visit with a [facility] provider today. Just as with appointments in the office, your consent must be obtained to participate. Your consent will be active for this visit and any virtual visit you may have with one of our providers in the next 365 days. If you have a MyChart account, a copy of this consent can be sent to you electronically.  As this is a virtual visit, video technology does not allow for your provider to perform a traditional examination. This may limit your provider's ability to fully assess your condition. If your provider identifies any concerns that need to be evaluated in person or the need to arrange testing (such as labs, EKG, etc.), we will make arrangements to do so. Although advances in technology are sophisticated, we cannot ensure that it will always work on either your end or our end. If the connection with a video visit is poor, the visit may have to be switched to a telephone visit. With either a video or telephone visit, we are not always able to ensure that we have a secure connection.  By engaging in this virtual visit, you consent to the provision of healthcare and authorize for your insurance to be billed (if applicable) for the services provided during this visit. Depending on your insurance coverage, you may receive a charge related to this service.  I need to obtain your verbal consent now. Are you willing to proceed with your visit today? Asher Pilat has provided verbal consent on 05/16/2024 for a virtual visit (video or telephone). Haze LELON Servant, NP  Date: 05/16/2024 2:18 PM   Virtual Visit via Video Note   I, Haze LELON Servant, connected with  Hayley Lawson  ([id number], 1996-04-15) on 05/16/24 at  2:15 PM EST by a video-enabled telemedicine application and verified that I am speaking with the correct person using two identifiers.  Location: Patient: Virtual Visit Location Patient:  Home Provider: Virtual Visit Location Provider: Home Office   I discussed the limitations of evaluation and management by telemedicine and the availability of in person appointments. The patient expressed understanding and agreed to proceed.    History of Present Illness: Hayley Lawson is a 29 y.o. who identifies as a female who was assigned female at birth, and is being seen today for fungal infection    Hayley Lawson has been experiencing itching in the perineal area. She also has a ringworm on her abdomen that is currently being treated with clotrimazole .She denies any GU symptoms. She states she is usually given fluconazole  by her dermatologist for a rash along her hairline however there is no rash present today on exam in the hairline area.      Problems:  Patient Active Problem List   Diagnosis Date Noted   Depression 03/19/2024   Obesity, Class II, BMI 35-39.9 01/28/2024   Hoarseness of voice 09/04/2023   Thyromegaly 09/04/2023   Primary hypertension 02/06/2023   Viral URI 09/10/2022   Migraines 09/05/2021   ADHD 09/05/2021    Allergies: Allergies[1] Medications: Current Medications[2]  Observations/Objective: Patient is well-developed, well-nourished in no acute distress.  Resting comfortably at home.  Head is normocephalic, atraumatic.  No labored breathing.  Speech is clear and coherent with logical content.  Patient is alert and oriented at baseline.    Assessment and Plan: 1. Yeast infection (Primary) - fluconazole  (DIFLUCAN ) 150 MG tablet; Take 1 tablet (150 mg total) by mouth once for  1 dose.  Dispense: 1 tablet; Refill: 0    Follow Up Instructions: I discussed the assessment and treatment plan with the patient. The patient was provided an opportunity to ask questions and all were answered. The patient agreed with the plan and demonstrated an understanding of the instructions.  A copy of instructions were sent to the patient via MyChart unless otherwise noted  below.    The patient was advised to call back or seek an in-person evaluation if the symptoms worsen or if the condition fails to improve as anticipated.    Neasia Fleeman W Marlin Brys, NP     [1]  Allergies Allergen Reactions   Bactrim [Sulfamethoxazole-Trimethoprim] Rash   Doxycycline Rash  [2]  Current Outpatient Medications:    fluconazole  (DIFLUCAN ) 150 MG tablet, Take 1 tablet (150 mg total) by mouth once for 1 dose., Disp: 1 tablet, Rfl: 0   ADDERALL  XR 20 MG 24 hr capsule, Take 1 capsule (20 mg total) by mouth daily., Disp: 30 capsule, Rfl: 0   amphetamine -dextroamphetamine  (ADDERALL  XR) 20 MG 24 hr capsule, Take 1 capsule (20 mg total) by mouth every morning., Disp: 30 capsule, Rfl: 0   clotrimazole  (CLOTRIMAZOLE  ANTI-FUNGAL) 1 % cream, Apply 1 Application topically 2 (two) times daily., Disp: 30 g, Rfl: 0   desvenlafaxine  (PRISTIQ ) 50 MG 24 hr tablet, Take 1 tablet (50 mg total) by mouth daily., Disp: 90 tablet, Rfl: 0   hydrOXYzine  (ATARAX ) 10 MG tablet, Take 1 tablet (10 mg total) by mouth 3 (three) times daily as needed for itching., Disp: 30 tablet, Rfl: 0   Prenatal Vit-Fe Fumarate-FA (PRENATAL PO), Take by mouth., Disp: , Rfl:   "

## 2024-05-16 NOTE — Patient Instructions (Signed)
" °  Hayley Lawson, thank you for joining Haze LELON Servant, NP for today's virtual visit.  While this provider is not your primary care provider (PCP), if your PCP is located in our provider database this encounter information will be shared with them immediately following your visit.   A [facility] MyChart account gives you access to today's visit and all your visits, tests, and labs performed at [facility]  click here if you don't have a [facility] MyChart account or go to [email or web address]  Consent: (Patient) Hayley Lawson provided verbal consent for this virtual visit at the beginning of the encounter.  Current Medications:  Current Outpatient Medications:    fluconazole  (DIFLUCAN ) 150 MG tablet, Take 1 tablet (150 mg total) by mouth once for 1 dose., Disp: 1 tablet, Rfl: 0   ADDERALL  XR 20 MG 24 hr capsule, Take 1 capsule (20 mg total) by mouth daily., Disp: 30 capsule, Rfl: 0   amphetamine -dextroamphetamine  (ADDERALL  XR) 20 MG 24 hr capsule, Take 1 capsule (20 mg total) by mouth every morning., Disp: 30 capsule, Rfl: 0   clotrimazole  (CLOTRIMAZOLE  ANTI-FUNGAL) 1 % cream, Apply 1 Application topically 2 (two) times daily., Disp: 30 g, Rfl: 0   desvenlafaxine  (PRISTIQ ) 50 MG 24 hr tablet, Take 1 tablet (50 mg total) by mouth daily., Disp: 90 tablet, Rfl: 0   hydrOXYzine  (ATARAX ) 10 MG tablet, Take 1 tablet (10 mg total) by mouth 3 (three) times daily as needed for itching., Disp: 30 tablet, Rfl: 0   Prenatal Vit-Fe Fumarate-FA (PRENATAL PO), Take by mouth., Disp: , Rfl:    Medications ordered in this encounter:  Meds ordered this encounter  Medications   fluconazole  (DIFLUCAN ) 150 MG tablet    Sig: Take 1 tablet (150 mg total) by mouth once for 1 dose.    Dispense:  1 tablet    Refill:  0    Supervising Provider:   LAMPTEY, PHILIP O [[id number]]     *If you need refills on other medications prior to your next appointment, please contact your  pharmacy*  Follow-Up: Call back or seek an in-person evaluation if the symptoms worsen or if the condition fails to improve as anticipated.  [facility] [phone]  Other Instructions    If you have been instructed to have an in-person evaluation today at a local Urgent Care facility, please use the link below. It will take you to a list of all of our available [facility], including address, phone number and hours of operation. Please do not delay care.  [facility]  If you or a family member do not have a primary care provider, use the link below to schedule a visit and establish care. When you choose a [facility] primary care physician or advanced practice provider, you gain a long-term partner in health. Find a Primary Care Provider  Learn more about [facility]'s in-office and virtual care options: [facility] - Get Care Now  "

## 2024-05-31 ENCOUNTER — Telehealth: Admitting: Family

## 2024-05-31 ENCOUNTER — Encounter (HOSPITAL_BASED_OUTPATIENT_CLINIC_OR_DEPARTMENT_OTHER): Payer: Self-pay | Admitting: Family Medicine

## 2024-05-31 DIAGNOSIS — Z7251 High risk heterosexual behavior: Secondary | ICD-10-CM

## 2024-05-31 NOTE — Progress Notes (Signed)
 I am sorry, but we can not fill Plan B through a virtual visit. I recommend that you be seen in a face-to-face visit.   NOTE: There will be NO CHARGE for this E-Visit   If you are having a true medical emergency, please call 911.     For an urgent face to face visit, [facility] has multiple urgent care centers for your convenience.  Click the link below for the full list of locations and hours, walk-in wait times, appointment scheduling options and driving directions:  Urgent Care - [place], [place], [place], [place], [place], [place]  [place]     Your MyChart E-visit questionnaire answers were reviewed by a board certified advanced clinical practitioner to complete your personal care plan based on your specific symptoms.    Thank you for using e-Visits.

## 2024-06-01 NOTE — Telephone Encounter (Signed)
 Please see mychart message sent by pt and advise.  Pt made aware that office is closed for in person today and that office will not be opening until 10am tomorrow 2/3. Let her know that we would give paperwork to PCP once received to have it filled out.

## 2024-06-03 ENCOUNTER — Other Ambulatory Visit (HOSPITAL_COMMUNITY): Payer: Self-pay

## 2024-06-05 ENCOUNTER — Other Ambulatory Visit (HOSPITAL_COMMUNITY): Payer: Self-pay

## 2024-06-10 ENCOUNTER — Encounter

## 2024-06-10 ENCOUNTER — Encounter (HOSPITAL_BASED_OUTPATIENT_CLINIC_OR_DEPARTMENT_OTHER): Admitting: Family Medicine

## 2024-06-23 ENCOUNTER — Ambulatory Visit (HOSPITAL_COMMUNITY): Payer: Self-pay | Admitting: Family
